# Patient Record
Sex: Female | Born: 1935 | Race: Black or African American | Hispanic: No | Marital: Married | Smoking: Never smoker
Health system: Southern US, Community
[De-identification: ages and names within clinical notes are randomized; demographics above are authoritative.]

## PROBLEM LIST (undated history)

## (undated) DIAGNOSIS — M1990 Unspecified osteoarthritis, unspecified site: Secondary | ICD-10-CM

## (undated) DIAGNOSIS — Z973 Presence of spectacles and contact lenses: Secondary | ICD-10-CM

## (undated) DIAGNOSIS — E119 Type 2 diabetes mellitus without complications: Secondary | ICD-10-CM

## (undated) DIAGNOSIS — I10 Essential (primary) hypertension: Secondary | ICD-10-CM

## (undated) DIAGNOSIS — C801 Malignant (primary) neoplasm, unspecified: Secondary | ICD-10-CM

## (undated) DIAGNOSIS — Z972 Presence of dental prosthetic device (complete) (partial): Secondary | ICD-10-CM

## (undated) HISTORY — PX: COLONOSCOPY: SHX174

## (undated) HISTORY — PX: ABDOMINAL HYSTERECTOMY: SHX81

---

## 2001-01-02 HISTORY — PX: PARTIAL COLECTOMY: SHX5273

## 2001-05-08 ENCOUNTER — Inpatient Hospital Stay (HOSPITAL_COMMUNITY): Admission: AD | Admit: 2001-05-08 | Discharge: 2001-05-27 | Payer: Self-pay | Admitting: Gastroenterology

## 2001-05-08 ENCOUNTER — Encounter (INDEPENDENT_AMBULATORY_CARE_PROVIDER_SITE_OTHER): Payer: Self-pay | Admitting: *Deleted

## 2001-05-08 ENCOUNTER — Encounter: Payer: Self-pay | Admitting: Gastroenterology

## 2001-05-09 ENCOUNTER — Encounter: Payer: Self-pay | Admitting: Gastroenterology

## 2001-05-12 ENCOUNTER — Encounter (HOSPITAL_BASED_OUTPATIENT_CLINIC_OR_DEPARTMENT_OTHER): Payer: Self-pay | Admitting: General Surgery

## 2001-05-13 ENCOUNTER — Encounter: Payer: Self-pay | Admitting: General Surgery

## 2001-05-14 ENCOUNTER — Encounter: Payer: Self-pay | Admitting: Gastroenterology

## 2001-05-18 ENCOUNTER — Encounter (HOSPITAL_BASED_OUTPATIENT_CLINIC_OR_DEPARTMENT_OTHER): Payer: Self-pay | Admitting: General Surgery

## 2001-05-21 ENCOUNTER — Encounter (HOSPITAL_BASED_OUTPATIENT_CLINIC_OR_DEPARTMENT_OTHER): Payer: Self-pay | Admitting: General Surgery

## 2003-12-16 ENCOUNTER — Ambulatory Visit: Payer: Self-pay | Admitting: Hematology & Oncology

## 2004-06-14 ENCOUNTER — Ambulatory Visit: Payer: Self-pay | Admitting: Hematology & Oncology

## 2004-12-20 ENCOUNTER — Ambulatory Visit: Payer: Self-pay | Admitting: Hematology & Oncology

## 2005-06-26 ENCOUNTER — Ambulatory Visit: Payer: Self-pay | Admitting: Hematology & Oncology

## 2005-06-28 LAB — COMPREHENSIVE METABOLIC PANEL
CO2: 27 mEq/L (ref 19–32)
Calcium: 9.8 mg/dL (ref 8.4–10.5)
Chloride: 103 mEq/L (ref 96–112)
Creatinine, Ser: 0.93 mg/dL (ref 0.40–1.20)
Glucose, Bld: 154 mg/dL — ABNORMAL HIGH (ref 70–99)
Sodium: 141 mEq/L (ref 135–145)
Total Bilirubin: 0.6 mg/dL (ref 0.3–1.2)
Total Protein: 7.5 g/dL (ref 6.0–8.3)

## 2005-06-28 LAB — CBC WITH DIFFERENTIAL/PLATELET
Eosinophils Absolute: 0.1 10*3/uL (ref 0.0–0.5)
HCT: 35.3 % (ref 34.8–46.6)
LYMPH%: 38.2 % (ref 14.0–48.0)
MONO#: 0.4 10*3/uL (ref 0.1–0.9)
NEUT#: 2.3 10*3/uL (ref 1.5–6.5)
NEUT%: 50.7 % (ref 39.6–76.8)
Platelets: 206 10*3/uL (ref 145–400)
WBC: 4.6 10*3/uL (ref 3.9–10.0)

## 2005-06-28 LAB — CEA: CEA: 6 ng/mL — ABNORMAL HIGH (ref 0.0–5.0)

## 2005-12-15 ENCOUNTER — Ambulatory Visit: Payer: Self-pay | Admitting: Hematology & Oncology

## 2005-12-20 LAB — CBC WITH DIFFERENTIAL/PLATELET
BASO%: 0.7 % (ref 0.0–2.0)
Basophils Absolute: 0 10*3/uL (ref 0.0–0.1)
EOS%: 1.1 % (ref 0.0–7.0)
Eosinophils Absolute: 0.1 10*3/uL (ref 0.0–0.5)
HCT: 35.3 % (ref 34.8–46.6)
HGB: 12.1 g/dL (ref 11.6–15.9)
LYMPH%: 35.2 % (ref 14.0–48.0)
MCH: 28.4 pg (ref 26.0–34.0)
MCHC: 34.3 g/dL (ref 32.0–36.0)
MCV: 82.7 fL (ref 81.0–101.0)
MONO#: 0.5 10*3/uL (ref 0.1–0.9)
MONO%: 9.2 % (ref 0.0–13.0)
NEUT#: 2.6 10*3/uL (ref 1.5–6.5)
NEUT%: 53.8 % (ref 39.6–76.8)
Platelets: 213 10*3/uL (ref 145–400)
RBC: 4.26 10*6/uL (ref 3.70–5.32)
RDW: 14.4 % (ref 11.3–14.5)
WBC: 4.9 10*3/uL (ref 3.9–10.0)
lymph#: 1.7 10*3/uL (ref 0.9–3.3)

## 2005-12-20 LAB — COMPREHENSIVE METABOLIC PANEL
Alkaline Phosphatase: 79 U/L (ref 39–117)
BUN: 13 mg/dL (ref 6–23)
Creatinine, Ser: 0.91 mg/dL (ref 0.40–1.20)
Glucose, Bld: 134 mg/dL — ABNORMAL HIGH (ref 70–99)
Sodium: 143 mEq/L (ref 135–145)
Total Bilirubin: 0.5 mg/dL (ref 0.3–1.2)

## 2006-06-18 ENCOUNTER — Ambulatory Visit: Payer: Self-pay | Admitting: Hematology & Oncology

## 2006-06-20 LAB — COMPREHENSIVE METABOLIC PANEL
Alkaline Phosphatase: 69 U/L (ref 39–117)
BUN: 11 mg/dL (ref 6–23)
CO2: 26 mEq/L (ref 19–32)
Creatinine, Ser: 0.84 mg/dL (ref 0.40–1.20)
Glucose, Bld: 159 mg/dL — ABNORMAL HIGH (ref 70–99)
Sodium: 141 mEq/L (ref 135–145)
Total Bilirubin: 0.5 mg/dL (ref 0.3–1.2)
Total Protein: 7.8 g/dL (ref 6.0–8.3)

## 2006-06-20 LAB — CBC WITH DIFFERENTIAL/PLATELET
Eosinophils Absolute: 0 10*3/uL (ref 0.0–0.5)
HCT: 34.9 % (ref 34.8–46.6)
LYMPH%: 37.7 % (ref 14.0–48.0)
MCV: 81.8 fL (ref 81.0–101.0)
MONO%: 8.2 % (ref 0.0–13.0)
NEUT#: 2.2 10*3/uL (ref 1.5–6.5)
NEUT%: 52.4 % (ref 39.6–76.8)
Platelets: 192 10*3/uL (ref 145–400)
RBC: 4.27 10*6/uL (ref 3.70–5.32)

## 2006-06-20 LAB — CEA: CEA: 8.5 ng/mL — ABNORMAL HIGH (ref 0.0–5.0)

## 2006-12-25 ENCOUNTER — Ambulatory Visit: Payer: Self-pay | Admitting: Gastroenterology

## 2010-05-20 NOTE — Consult Note (Signed)
[place]. [facility]  Patient:    Danielle Bright Visit Number: [id number] MRN: [id number]          Service Type: MED Location: [phone] Attending Physician:  Orland Mustard Dictated by:   Rosemarie Ax, N.P. Admit Date:  05/08/2001   CC:         Danielle Bright, M.D.  Danielle Bright, M.D.  [facility]  Dr. Volney Presser in [facility] L. Randa Bright, M.D.   Consultation Report  REFERRING PHYSICIAN:  James L. Randa Bright, M.D.  REASON FOR CONSULTATION:  Adenocarcinoma of the colon.  HISTORY OF PRESENT ILLNESS:  This is a 77 year old female, status post right hemicolectomy, ileocolectomy, with ileal transverse colotomy on April 21, 2001 by Danielle Bright in [place], [place].  Dr. Lorelei Pont report showed moderate well-differentiated adenocarcinoma extending through the outer muscular wall to involve the mesentery.  There were 0 of 12 lymph nodes showing metastatic disease.  The margins were clear and there was vascular invasion observed.  A CT of the abdomen preoperatively showed no liver metastasis or adenopathy.  Since surgery, Danielle Bright has displayed signs of partial small-bowel obstruction and a Gastrografin study confirmed the same on May 01, 2001.  She has been admitted for an exploratory laparotomy tomorrow on May 15, 2001 due to obstruction, possibly secondary to adhesions.  PAST MEDICAL HISTORY:  Hypertension.  Diabetes mellitus type 2.  Fibrocystic breast disease.  Hypercholesterolemia.  PAST SURGICAL HISTORY:  Colectomy.  Hysterectomy in 1980s.  ALLERGIES:  No known drug allergies.  MEDICATIONS: 1. Glucophage 500 mg t.i.d. 2. Altace 5 mg q.d. 3. Over-the-counter vitamin E.  FAMILY HISTORY:  Danielle Bright mother died of a CVA in 79.  Her father died in his 46s of a MI.  She had two brothers, one has died of esophageal cancer and one brother is alive.  She has two sisters who are living, one with  colon cancer and one alive and well.  She has several aunts and uncles with cancer which she states multiple types such as breast, cervical, lungs.  SOCIAL HISTORY:  Danielle Bright is married.  She is a retired Astronomer. now Psychologist, educational.  She has two daughters living in the [place] area and she exercises regularly.  REVIEW OF SYSTEMS:  She denies any shortness of breath, headache.  No cough, no chest or pleuritic pain.  She denies any nausea or vomiting and had literally no symptoms prior to a routine colonoscopy.  Her stools for hemoccult were negative.  She denies any problems with constipation or diarrhea prior to the surgery.  She has had no hematochezia.  No weight gain or weight loss.  She denies any increase in weakness or fatigue.  No night sweats.  PHYSICAL EXAMINATION:  GENERAL:  This is a pleasant 77 year old African-American female in no acute distress.  VITAL SIGNS:  Temperature is 99.5, pulse is 96, respirations 20, blood pressure is 138/61.  O2 is at 92% on room air.  HEENT:  Normocephalic.  Sclerae anicteric.  Mucus membranes moist.  Tongue is with a white coating without any lesions.  PERRLA.  EOMI.  LYMPH NODES:  The neck displays a right anterior cervical pulsating node. There are no axillary or inguinal nodes appreciated.  LUNGS:  Clear to auscultation bilaterally.  CARDIOVASCULAR:  Regular rate and rhythm.  No murmur and no gallop.  ABDOMEN:  The positive right upper and right lower quadrant tenderness with some distention.  EXTREMITIES:  Without clubbing, cyanosis, or edema.  There is no calf tenderness.  NEUROLOGICAL:  Alert and oriented x 3.  Strength is 5/5.  Cranial nerves II-XII are intact.  LABORATORY DATA:  CEA on April 21, 2001 was 7.9.  WBC upon admission 7.2, hemoglobin was 11.1, hematocrit 33.3, platelets are at 391,000.  Sodium 134, potassium 5.8, chloride 95, CO2 30, glucose 246, BUN 53, creatinine 1.3, calcium 9.7, total  protein 7.7, albumin 3.5, prealbumin 16.7 on May 11, 2001.  Abdominal films reveal a worsening small-bowel obstruction on May 12, 2001.  ASSESSMENT/PLAN:  This 77 year old female awaiting exploratory surgery for small-bowel obstruction, possibly due to secondary to abdominal adhesions. 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  We will be glad to follow Danielle Bright with you and will complete her staging with CTs of the neck and chest postsurgically and abdomen postsurgically.  The patient was seen and examined by Dr. Quintin Alto C. Bright and he will be glad to follow the patient postsurgically.Dictated by:   Rosemarie Ax, N.P. Attending Physician:  Orland Mustard DD:  05/14/01 TD:  05/15/01 Job: [id number]

## 2010-05-20 NOTE — Op Note (Signed)
Danielle Bright. [facility]  Patient:    Danielle Bright, Danielle Bright Visit Number: [id number] MRN: [id number]          Service Type: MED Location: [phone] Attending Physician:  Orland Mustard Dictated by:   Mardene Celeste. Lurene Shadow, M.D. Proc. Date: 05/15/01 Admit Date:  05/08/2001   CC:         Danielle Bright, M.D.   Operative Report  PREOPERATIVE DIAGNOSIS:  High grade partial small bowel obstruction following a right hemicolectomy.  POSTOPERATIVE DIAGNOSIS:  High grade partial small bowel obstruction secondary to adhesions and anastomotic stricture.  OPERATION PERFORMED:  Exploratory laparotomy, lysis of adhesions, small bowel resection, resection or ileocolic anastomosis, reanastomosis of ileum to colon.  SURGEON:  Mardene Celeste. Lurene Shadow, M.D.  ASSISTANT:  Marnee Spring. Wiliam Ke, M.D.  ANESTHESIA:  General.  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  Subsequent to her operation, she did well except that she was unable to tolerate oral feedings having recurrent episodes of distention, nausea and vomiting.  She was observed with total parenteral nutrition and n.p.o. for a while; however, she did not resolve.  She was subsequently transferred to the [facility] where she was continued on observation and total parenteral nutrition without any resolution in her symptoms.  Following full disclosure of the risks and potential benefits of surgery, the patient gives consent and is brought to the operating room now for exploration, adhesiolysis with possible bowel resection.  DESCRIPTION OF PROCEDURE:  Following the induction of satisfactory general anesthesia with the patient positioned supinely, the abdomen was prepped and draped to be included in a sterile operative field.  The old previously made midline incision was reopened and  deepened through the skin and subcutaneous tissues down through the peritoneum.  There were multiple fibrous and difficult adhesions to the anterior abdominal wall which were dissected free. There were multiple loops of gnarled and bowel stuck deep within the pelvis and then dissecting this free, enterotomies were created and a small bowel resection was necessary.  The small bowel resection was carried out by dividing the bowel between GIA 75 staplers and performing a functional end-to-end GIA stapled anastomosis closed with a TA-60 stapling device.  This anastomosis was noted to be widely patent and the bowel appeared to be fully viable.  Dissection was carried out lysing multiple very dense adhesions up to the region of the anastomosis which was located in the subhepatic space.  This was mobilized.  The region of the anastomosis appeared to be quite strictured and fibrotic and the proximal colon and distal small bowel were then divided between clamps and secured and the mesentery secured with ties of 2-0 silk. The resected bowel which included the proximal colon and distal ileum was removed and forwarded for pathologic evaluation.  Again, a functional end-to-end anastomosis carried out with a GIA stapling device and a TA60 stapling device.  The ileocolic anastomosis was noted to be widely patent. The small intestine was run from the ileocolic anastomosis retrograde to the ligament of Treitz with all adhesions having been taken down.  The peritoneal cavity was then thoroughly irrigated with multiple aliquots of normal saline. Sponge, instrument and sharp counts were verified.  I used two sheets of seprafilm to separate the bowel from the anterior abdominal wall and then closed the anterior abdominal wall with a running suture of running #1  Novofil.  The subcutaneous tissues were then thoroughly irrigated with normal saline and the skin was closed with stainless steel staples.   Sterile dressings applied.  Anesthetic reversed.  Patient removed from the operating room to the recovery room in stable condition having tolerated the procedure well. Dictated by:   Mardene Celeste. Lurene Shadow, M.D. Attending Physician:  Orland Mustard DD:  05/15/01 TD:  05/16/01 Job: [id number]

## 2010-05-20 NOTE — Discharge Summary (Signed)
NAMEDEVONNA, OBOYLE NO.:  [id number]   MEDICAL RECORD NO.:  [id number]                   PATIENT TYPE:  NP   LOCATION:  5710                                 FACILITY:  [facility]   PHYSICIAN:  Luisa Hart L. Lurene Shadow, M.D.             DATE OF BIRTH:  Apr 30, 1927   DATE OF ADMISSION:  05/08/2001  DATE OF DISCHARGE:  05/27/2001                                 DISCHARGE SUMMARY   ADMISSION DIAGNOSES:  Partial small bowel obstruction.   DISCHARGE DIAGNOSES:  Partial small bowel obstruction, type 2 diabetes  mellitus, hypercholesterolemia, hypertension.   PROCEDURE:  Exploratory laparotomy with adhesiolysis, small bowel resection,  resection of ileocolic anastomosis with neoileocolic anastomosis.   COMPLICATIONS:  None.   CONDITION ON DISCHARGE:  Improved.   CHIEF COMPLAINT:  The patient is a 77 year old lady admitted to the hospital  by Dr. Vilinda Boehringer.  She had undergone a right hemicolectomy and  ileocolostomy in the early part of April and initially did well, but  continued to have symptoms of recurrent episodes of small bowel obstruction  noted by nausea, vomiting, and abdominal distention.  It was felt initially  that this was due to the anastomotic site and she was treated  conservatively, later placed on total nutritional admixture for a while.  However, her bowel obstructive symptoms did not improve.  She was  subsequently transferred to [facility] under Dr. Randa Evens' care and  on May 09, 2001 I saw her for initial consultation.  At that time she was  feeling somewhat better and had actually passed some very small liquid bowel  movements.  Her bowel sounds were still obstructive.  It was decided at that  time to resume her TNA and continue to observe her to see whether or not her  bowel obstruction would resolve.  Over the ensuing days she did get  transiently better but upon an attempt at feeding she continued with signs  of bowel  obstruction.  Prior to surgery she was seen in consultation by  oncologist, Dr. Lyndal Pulley, who will continue to follow her for what was  reported as a Dukes B1 carcinoma of the colon.  On May 15, 2001 we decided  to go ahead and explore the patient and she was taken to the operating room  where she underwent exploration that showed very dense adhesions with a  stricture at the anastomotic site.  She underwent adhesiolysis with small  bowel resection and resection of the ileocolic anastomosis with the  reconstitution of her bowel continuity.  Her postoperative course was benign  except for a mildly prolonged ileus.  In the interim she was seen in  consultation by Dr. Margaretmary Bayley, endocrinology for continued monitoring  and management of her diabetes.  During postoperative period she was  continued on total parenteral nutrition.  On May 32 she finally had begun to  pass  flatus freely and subsequently continued to have bowel movements  starting on her eighth postoperative day.  Her diet was advanced slowly and  on May 27, 2001 she was discharged home to be followed up in the office in  two weeks.   DISCHARGE MEDICATIONS:  1. Maxidone for pain p.r.n.  2. Altace for control of her blood pressure.  3. She was discharged home on a trial of Glucophage.    ACTIVITY:  As tolerated.   DIET:  2000 calorie ADA diet.                                               Mardene Celeste Lurene Shadow, M.D.    PLB/MEDQ  D:  07/30/2001  T:  08/02/2001  Job:  [id number]

## 2010-05-20 NOTE — H&P (Signed)
[place]. [facility]  Patient:    Danielle Bright, Danielle Bright Visit Number: [id number] MRN: [id number]          Service Type: MED Location: [phone] Attending Physician:  Orland Mustard Dictated by:   Llana Aliment. Randa Evens, M.D. Proc. Date: 05/08/01 Admit Date:  05/08/2001   CC:         [place] Qua, [place], [place]  Luisa Hart L. Lurene Shadow, M.D.   History and Physical  DATE OF BIRTH:  Jan 14, 1927.  REASON FOR ADMISSION:  Postoperative small bowel obstruction.  HISTORY OF PRESENT ILLNESS:  A 77 year old woman who underwent a screening colonoscopy at [facility], was found to have a cecal cancer.  She was admitted on April 20 and underwent a resection of this.  This was done by Dr. Volney Presser.  At the time of surgery she was noted to have extensive adhesions of the small bowel.  She underwent a right hemicolectomy, ileocolectomy, with an ileotransverse colotomy.  The pathology report returned showing adenocarcinoma of the colonic mucosa, moderately well-differentiated.  It extended through the outer muscular wall to involve the mesentery.  This was felt to be a Dukes B1.  All the lymph nodes were negative.  The patient has had a rocky postoperative course and has just never really improved.  She has had abdominal films and small bowel series and CTs showing a partial small bowel obstruction.  This had been treated conservatively.  On April 30 she had a Gastrografin small bowel series that showed a partial obstruction.  She has continued to have this and just has not gotten well, and she and her family would like to be transferred here.  She has been on a multitude of medications, specifically Zelnorm, Reglan, erythromycin, all of which has failed to help.  If anything, she feels that they have made things worse.  She has still bee unable to take anything but sips of water and ice chips.  She is passing air.  She is having some flatulent episodes with some stool.  At  this point in time, she is approximately three weeks out from her surgery and still is not better.  MEDICATIONS:  Glucophage, Altace, Lipitor, sliding scale insulin, Reglan, Zelnorm, Zofran, erythromycin, Valium, and Intralipids.  ALLERGIES:  The patient has no drug allergies.  PAST MEDICAL HISTORY:  The patient has mild hypertension and noninsulin-dependent diabetes.  PAST SURGICAL HISTORY:  Her only previous surgery prior to the colectomy was a hysterectomy in the 1980s.  FAMILY HISTORY:  Mother died of a stroke.  Father died of a heart attack. There is a sibling who did have colon cancer approximately three years ago and is still living.  There are gallstones in the family.  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  PHYSICAL EXAMINATION:  VITAL SIGNS:  Temperature 97.6, pulse 100, blood pressure 106/66.  GENERAL:  A pleasant black female in no acute distress.  HEENT:  Sclerae are not icteric.  NECK:  Supple, no lymphadenopathy.  CHEST:  Lungs clear.  CARDIAC:  Regular rate and rhythm without murmurs or gallops.  ABDOMEN:  Soft with a well-healed surgical scar in the midline, is slightly distended.  Bowel sounds appear normal.  I do not appreciate anything more than the normal amount of postoperative tenderness.  RECTAL:  Not done at this time.  EXTREMITIES:  There is an IV in the left hand.  No  pitting edema.  Good peripheral pulses.  ASSESSMENT: 1. Probable postoperative small bowel obstruction, more than likely due to    adhesions.  The patient had adhesions noted on exam and at this point I    think likely come to a re-exploration. 2. Dukes B1 carcinoma of the cecum, status post right hemicolectomy. 3. Status post hysterectomy. 4. Noninsulin-dependent diabetes.  PLAN:  Will admit to the hospital, place on IV antibiotics, order acute abdominal series to look for signs of obstruction.   Will ask Dr. Lurene Shadow to see in consultation.  The patient will likely need an oncology consultation while here as well.  Will get routine labs and go from there. Dictated by:   Llana Aliment. Randa Evens, M.D. Attending Physician:  Orland Mustard DD:  05/09/01 TD:  05/10/01 Job: [id number]

## 2011-07-17 ENCOUNTER — Ambulatory Visit: Payer: Medicare Other

## 2011-07-17 ENCOUNTER — Other Ambulatory Visit (HOSPITAL_BASED_OUTPATIENT_CLINIC_OR_DEPARTMENT_OTHER): Payer: Medicare Other | Admitting: Lab

## 2011-07-17 ENCOUNTER — Ambulatory Visit (HOSPITAL_BASED_OUTPATIENT_CLINIC_OR_DEPARTMENT_OTHER): Payer: Medicare Other | Admitting: Hematology & Oncology

## 2011-07-17 ENCOUNTER — Ambulatory Visit: Payer: Medicare Other | Admitting: Medical

## 2011-07-17 VITALS — BP 150/82 | HR 80 | Temp 97.9°F | Ht 65.0 in | Wt 132.0 lb

## 2011-07-17 DIAGNOSIS — C189 Malignant neoplasm of colon, unspecified: Secondary | ICD-10-CM

## 2011-07-17 DIAGNOSIS — D649 Anemia, unspecified: Secondary | ICD-10-CM

## 2011-07-17 DIAGNOSIS — Z85038 Personal history of other malignant neoplasm of large intestine: Secondary | ICD-10-CM

## 2011-07-17 DIAGNOSIS — R970 Elevated carcinoembryonic antigen [CEA]: Secondary | ICD-10-CM

## 2011-07-17 LAB — COMPREHENSIVE METABOLIC PANEL
ALT: 19 U/L (ref 0–35)
AST: 24 U/L (ref 0–37)
Albumin: 4.6 g/dL (ref 3.5–5.2)
Calcium: 10.1 mg/dL (ref 8.4–10.5)
Chloride: 106 mEq/L (ref 96–112)
Creatinine, Ser: 0.97 mg/dL (ref 0.50–1.10)
Potassium: 4.2 mEq/L (ref 3.5–5.3)

## 2011-07-17 LAB — IRON AND TIBC
%SAT: 27 % (ref 20–55)
UIBC: 236 ug/dL (ref 125–400)

## 2011-07-17 LAB — CEA: CEA: 4.8 ng/mL (ref 0.0–5.0)

## 2011-07-17 LAB — CBC WITH DIFFERENTIAL (CANCER CENTER ONLY)
BASO%: 0.8 % (ref 0.0–2.0)
EOS%: 1.8 % (ref 0.0–7.0)
LYMPH#: 2.6 10*3/uL (ref 0.9–3.3)
MCHC: 35.5 g/dL (ref 32.0–36.0)
NEUT#: 3.2 10*3/uL (ref 1.5–6.5)
RDW: 13.7 % (ref 11.1–15.7)

## 2011-07-17 NOTE — Progress Notes (Signed)
This office note has been dictated.

## 2011-07-18 DIAGNOSIS — Z85038 Personal history of other malignant neoplasm of large intestine: Secondary | ICD-10-CM | POA: Insufficient documentation

## 2011-07-18 NOTE — Progress Notes (Signed)
CC:   [employer], Fax [phone]  DIAGNOSES: 1. Elevated CEA. 2. 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 3.  HISTORY OF PRESENT ILLNESS:  Danielle Bright is a very nice 78 year old African American female.  We first saw her back in 2003.  At that point in time, she had adenocarcinoma of the transverse colon.  She had stage II disease.  This was resected.  She did not need any type of adjuvant chemotherapy.  We last saw her back in June of 2008.  At that point in time, her CEA was I think at 4.7.  She now is being followed at [facility].  She has been noted to have a rise in her CEA.  She has diabetes that is not that well-controlled.  She is under stress at home trying to take care of a husband who has Alzheimer's.  She has not recalled any changes in medications.  Going back to June of 2012, CEA was 7.6.  She had elevated hemoglobin A1c.  LFTs were normal.  BUN and creatinine were okay.  I think in June of 2013, her CEA was found to be a little more elevated at 8.3.  Because of the increase in the CEA, it was felt that she needed to come back to the [facility] for evaluation and possibility of studies.  Again, she feels okay.  She has not noted any abdominal pain.  There is no early satiety.  There is no cough.  There are no rashes.  She has had no arm or leg swelling.  There has been no bleeding.  There is no headache.  She has had her routine health care exam, mammogram and colonoscopy.  I think last colonoscopy was 3 years ago.  Again, she was kindly referred back to the [facility] system so that we could provide followup for this elevated CEA.  Again, she feels well.  She really has no specific complaints.  PAST MEDICAL HISTORY:  Remarkable for: 1. Diabetes. 2. Hypertension. 3. Anxiety.  ALLERGIES:  None.  MEDICATIONS: 1. Norvasc 5 mg p.o. daily. 2. Bystolic 20 mg p.o. daily. 3. Klonopin 0.5  mg p.o. b.i.d. p.r.n. 4. Hydrochlorothiazide 12.5 mg p.o. daily. 5. Metformin 1000 mg p.o. b.i.d. 6. Altace 10 mg p.o. b.i.d. 7. Zoloft 50 mg p.o. daily.  SOCIAL HISTORY:  Negative for tobacco use.  There is no alcohol use.  FAMILY HISTORY:  Noncontributory.  REVIEW OF SYSTEMS:  As stated in history of present illness.  No additional findings noted on a 12 system review.  PHYSICAL EXAMINATION:  General:  This is an elderly but well-nourished black female in no obvious distress.  Vital signs:  Show a temperature of 97.9, pulse 80, respiratory rate 18, blood pressure 150/82.  Weight is 132.  Head and neck:  Shows a normocephalic, atraumatic skull.  There are no ocular or oral lesions.  There are no palpable cervical or supraclavicular lymph nodes.  Lungs:  Clear bilaterally.  Cardiac: Regular rate and rhythm with a normal S1 and S2.  There are no murmurs, rubs or bruits.  Abdomen:  Soft with good bowel sounds.  There is no palpable abdominal mass.  There is no fluid wave.  There is no fluid. There is a little keloid scar from her laparotomy wound.  There is no palpable hepatosplenomegaly.  Back:  No tenderness over the spine, ribs or hips.  Extremities:  Shows no clubbing, cyanosis or edema. Neurological:  Shows  no focal neurological deficits.  LABORATORY STUDIES:  Show a white cell count of 6.6, hemoglobin 11.6, hematocrit 32.7, platelet count is 188.  IMPRESSION:  Danielle Bright is a very nice 78 year old African American female.  She had stage II colon cancer 10 years ago.  I just cannot imagine that this is recurring.  I am not sure why the CEA would be a little bit elevated.  I could not imagine her having a 2nd kind of cancer.  I would not pursue random scanning on her at this point in time unless something specific shows up.  She does have diabetes.  I do not think this really would affect her CEA level.  I do not see any evidence of infection.  She does not smoke.  I am just  willing to follow along for right now.  I really believe that we can just be cautious.  I suppose a 2nd malignancy is possible although I certainly cannot find this on her physical exam.  We will not make a plan for Danielle Bright to come back as of yet.  I want to try to take care of everything over the phone if I can.  I spent a good hour or so with Danielle Bright.  It was nice to see her again. It has been a good 7 years since I last saw her.    ______________________________ Josph Macho, M.D. PRE/MEDQ  D:  07/17/2011  T:  07/17/2011  Job:  [id number]

## 2011-07-20 ENCOUNTER — Other Ambulatory Visit: Payer: Self-pay

## 2011-07-21 ENCOUNTER — Telehealth: Payer: Self-pay | Admitting: *Deleted

## 2011-07-21 NOTE — Telephone Encounter (Signed)
Message copied by Anselm Jungling on Fri Jul 21, 2011 11:49 AM ------      Message from: Arlan Organ R      Created: Thu Jul 20, 2011  5:34 PM       Call her and let her that her tumor marker-CEA-is normal. No need for any additional tests or x-rays. Thanks. Cindee Lame

## 2011-07-21 NOTE — Telephone Encounter (Signed)
Called patient left a message for her to call us back about her lab results.

## 2011-07-21 NOTE — Telephone Encounter (Signed)
Message copied by Anselm Jungling on Fri Jul 21, 2011 11:00 AM ------      Message from: Arlan Organ R      Created: Thu Jul 20, 2011  5:34 PM       Call her and let her that her tumor marker-CEA-is normal. No need for any additional tests or x-rays. Thanks. Cindee Lame

## 2011-07-21 NOTE — Telephone Encounter (Signed)
Called patient to let her know that her tumor marker (CEA) was normal and no need for additional tests or xrays'

## 2012-07-26 ENCOUNTER — Other Ambulatory Visit: Payer: Self-pay | Admitting: Orthopedic Surgery

## 2012-08-13 ENCOUNTER — Encounter (HOSPITAL_BASED_OUTPATIENT_CLINIC_OR_DEPARTMENT_OTHER): Payer: Self-pay | Admitting: *Deleted

## 2012-08-13 NOTE — Progress Notes (Signed)
Pt very sharp-husband in nursing home-said she had recent lab and ekg [facility] will call

## 2012-08-15 ENCOUNTER — Encounter (HOSPITAL_BASED_OUTPATIENT_CLINIC_OR_DEPARTMENT_OTHER): Payer: Self-pay | Admitting: Anesthesiology

## 2012-08-15 ENCOUNTER — Ambulatory Visit (HOSPITAL_BASED_OUTPATIENT_CLINIC_OR_DEPARTMENT_OTHER)
Admission: RE | Admit: 2012-08-15 | Discharge: 2012-08-15 | Disposition: A | Discharge status: Home / Self Care | Payer: Medicare Other | Source: Ambulatory Visit | Attending: Orthopedic Surgery | Admitting: Orthopedic Surgery

## 2012-08-15 ENCOUNTER — Ambulatory Visit (HOSPITAL_BASED_OUTPATIENT_CLINIC_OR_DEPARTMENT_OTHER): Payer: Medicare Other | Admitting: Anesthesiology

## 2012-08-15 ENCOUNTER — Encounter (HOSPITAL_BASED_OUTPATIENT_CLINIC_OR_DEPARTMENT_OTHER): Payer: Self-pay | Admitting: *Deleted

## 2012-08-15 ENCOUNTER — Encounter (HOSPITAL_BASED_OUTPATIENT_CLINIC_OR_DEPARTMENT_OTHER)
Admission: RE | Disposition: A | Discharge status: Home / Self Care | Payer: Self-pay | Source: Ambulatory Visit | Attending: Orthopedic Surgery

## 2012-08-15 DIAGNOSIS — M129 Arthropathy, unspecified: Secondary | ICD-10-CM | POA: Insufficient documentation

## 2012-08-15 DIAGNOSIS — Z79899 Other long term (current) drug therapy: Secondary | ICD-10-CM | POA: Insufficient documentation

## 2012-08-15 DIAGNOSIS — E119 Type 2 diabetes mellitus without complications: Secondary | ICD-10-CM | POA: Insufficient documentation

## 2012-08-15 DIAGNOSIS — Z85038 Personal history of other malignant neoplasm of large intestine: Secondary | ICD-10-CM | POA: Insufficient documentation

## 2012-08-15 DIAGNOSIS — Z9049 Acquired absence of other specified parts of digestive tract: Secondary | ICD-10-CM | POA: Insufficient documentation

## 2012-08-15 DIAGNOSIS — L819 Disorder of pigmentation, unspecified: Secondary | ICD-10-CM | POA: Insufficient documentation

## 2012-08-15 DIAGNOSIS — I10 Essential (primary) hypertension: Secondary | ICD-10-CM | POA: Insufficient documentation

## 2012-08-15 DIAGNOSIS — L03019 Cellulitis of unspecified finger: Secondary | ICD-10-CM | POA: Insufficient documentation

## 2012-08-15 HISTORY — DX: Essential (primary) hypertension: I10

## 2012-08-15 HISTORY — DX: Presence of spectacles and contact lenses: Z97.3

## 2012-08-15 HISTORY — DX: Type 2 diabetes mellitus without complications: E11.9

## 2012-08-15 HISTORY — DX: Malignant (primary) neoplasm, unspecified: C80.1

## 2012-08-15 HISTORY — DX: Presence of dental prosthetic device (complete) (partial): Z97.2

## 2012-08-15 HISTORY — DX: Unspecified osteoarthritis, unspecified site: M19.90

## 2012-08-15 HISTORY — PX: NAILBED REPAIR: SHX5028

## 2012-08-15 LAB — POCT I-STAT, CHEM 8
Calcium, Ion: 1.17 mmol/L (ref 1.13–1.30)
Glucose, Bld: 155 mg/dL — ABNORMAL HIGH (ref 70–99)
HCT: 37 % (ref 36.0–46.0)
Hemoglobin: 12.6 g/dL (ref 12.0–15.0)
TCO2: 27 mmol/L (ref 0–100)

## 2012-08-15 LAB — GLUCOSE, CAPILLARY: Glucose-Capillary: 136 mg/dL — ABNORMAL HIGH (ref 70–99)

## 2012-08-15 SURGERY — REPAIR, NAIL BED
Anesthesia: Monitor Anesthesia Care | Site: Thumb | Laterality: Left | Wound class: Clean

## 2012-08-15 MED ORDER — CHLORHEXIDINE GLUCONATE 4 % EX LIQD: 60.0000 mL | Freq: Once | CUTANEOUS | Status: DC

## 2012-08-15 MED ORDER — FENTANYL CITRATE 0.05 MG/ML IJ SOLN: 25.0000 ug | INTRAMUSCULAR | Status: DC | PRN

## 2012-08-15 MED ORDER — ONDANSETRON HCL 4 MG/2ML IJ SOLN
INTRAMUSCULAR | Status: DC | PRN
  Administered 2012-08-15: 4 mg via INTRAVENOUS

## 2012-08-15 MED ORDER — BUPIVACAINE HCL (PF) 0.25 % IJ SOLN
INTRAMUSCULAR | Status: DC | PRN
  Administered 2012-08-15: 4.5 mL

## 2012-08-15 MED ORDER — ONDANSETRON HCL 4 MG/2ML IJ SOLN: 4.0000 mg | Freq: Once | INTRAMUSCULAR | Status: DC | PRN

## 2012-08-15 MED ORDER — PROPOFOL 10 MG/ML IV EMUL
INTRAVENOUS | Status: DC | PRN
  Administered 2012-08-15: 25 ug/kg/min via INTRAVENOUS

## 2012-08-15 MED ORDER — HYDROCODONE-ACETAMINOPHEN 5-325 MG PO TABS: ORAL_TABLET | ORAL | Status: DC

## 2012-08-15 MED ORDER — LIDOCAINE HCL (PF) 1 % IJ SOLN
INTRAMUSCULAR | Status: DC | PRN
  Administered 2012-08-15: 4.5 mL

## 2012-08-15 MED ORDER — LACTATED RINGERS IV SOLN
INTRAVENOUS | Status: DC
  Administered 2012-08-15: 08:00:00 via INTRAVENOUS

## 2012-08-15 MED ORDER — MIDAZOLAM HCL 5 MG/5ML IJ SOLN
INTRAMUSCULAR | Status: DC | PRN
  Administered 2012-08-15: 1 mg via INTRAVENOUS

## 2012-08-15 MED ORDER — CEFAZOLIN SODIUM-DEXTROSE 2-3 GM-% IV SOLR
2.0000 g | INTRAVENOUS | Status: AC
  Administered 2012-08-15: 2 g via INTRAVENOUS

## 2012-08-15 MED ORDER — FENTANYL CITRATE 0.05 MG/ML IJ SOLN
INTRAMUSCULAR | Status: DC | PRN
  Administered 2012-08-15: 50 ug via INTRAVENOUS

## 2012-08-15 MED ORDER — MIDAZOLAM HCL 2 MG/2ML IJ SOLN: 1.0000 mg | INTRAMUSCULAR | Status: DC | PRN

## 2012-08-15 MED ORDER — FENTANYL CITRATE 0.05 MG/ML IJ SOLN: 50.0000 ug | INTRAMUSCULAR | Status: DC | PRN

## 2012-08-15 MED ORDER — PROPOFOL 10 MG/ML IV BOLUS
INTRAVENOUS | Status: DC | PRN
  Administered 2012-08-15: 50 mg via INTRAVENOUS

## 2012-08-15 SURGICAL SUPPLY — 51 items
BANDAGE CONFORM 2  STR LF (GAUZE/BANDAGES/DRESSINGS) ×1 IMPLANT
BANDAGE ELASTIC 3 VELCRO ST LF (GAUZE/BANDAGES/DRESSINGS) IMPLANT
BLADE MINI RND TIP GREEN BEAV (BLADE) ×1 IMPLANT
BLADE SURG 15 STRL LF DISP TIS (BLADE) ×2 IMPLANT
BLADE SURG 15 STRL SS (BLADE) ×4
BNDG CMPR 9X4 STRL LF SNTH (GAUZE/BANDAGES/DRESSINGS) ×1
BNDG CMPR MD 5X2 ELC HKLP STRL (GAUZE/BANDAGES/DRESSINGS)
BNDG COHESIVE 1X5 TAN STRL LF (GAUZE/BANDAGES/DRESSINGS) ×1 IMPLANT
BNDG ELASTIC 2 VLCR STRL LF (GAUZE/BANDAGES/DRESSINGS) IMPLANT
BNDG ESMARK 4X9 LF (GAUZE/BANDAGES/DRESSINGS) ×1 IMPLANT
CHLORAPREP W/TINT 26ML (MISCELLANEOUS) ×2 IMPLANT
CLOTH BEACON ORANGE TIMEOUT ST (SAFETY) ×2 IMPLANT
CORDS BIPOLAR (ELECTRODE) ×1 IMPLANT
COVER MAYO STAND STRL (DRAPES) ×2 IMPLANT
COVER TABLE BACK 60X90 (DRAPES) ×2 IMPLANT
DRAPE EXTREMITY T 121X128X90 (DRAPE) ×2 IMPLANT
DRAPE SURG 17X23 STRL (DRAPES) ×2 IMPLANT
GAUZE XEROFORM 1X8 LF (GAUZE/BANDAGES/DRESSINGS) ×2 IMPLANT
GLOVE BIO SURGEON STRL SZ7.5 (GLOVE) ×2 IMPLANT
GLOVE BIOGEL PI IND STRL 7.0 (GLOVE) IMPLANT
GLOVE BIOGEL PI INDICATOR 7.0 (GLOVE) ×1
GLOVE ECLIPSE 6.5 STRL STRAW (GLOVE) ×1 IMPLANT
GLOVE EXAM NITRILE LRG STRL (GLOVE) ×1 IMPLANT
GOWN BRE IMP PREV XXLGXLNG (GOWN DISPOSABLE) ×2 IMPLANT
GOWN PREVENTION PLUS XLARGE (GOWN DISPOSABLE) ×2 IMPLANT
NDL HYPO 25X1 1.5 SAFETY (NEEDLE) IMPLANT
NEEDLE HYPO 25X1 1.5 SAFETY (NEEDLE) ×2 IMPLANT
NS IRRIG 1000ML POUR BTL (IV SOLUTION) ×2 IMPLANT
PACK BASIN DAY SURGERY FS (CUSTOM PROCEDURE TRAY) ×2 IMPLANT
PAD CAST 3X4 CTTN HI CHSV (CAST SUPPLIES) IMPLANT
PAD CAST 4YDX4 CTTN HI CHSV (CAST SUPPLIES) IMPLANT
PADDING CAST ABS 4INX4YD NS (CAST SUPPLIES)
PADDING CAST ABS COTTON 4X4 ST (CAST SUPPLIES) ×1 IMPLANT
PADDING CAST COTTON 3X4 STRL (CAST SUPPLIES)
PADDING CAST COTTON 4X4 STRL (CAST SUPPLIES)
SPLINT FINGER 5/8X3.25 (SOFTGOODS) IMPLANT
SPLINT FINGER FOAM 3 9119 05 (SOFTGOODS) ×2
SPONGE GAUZE 4X4 12PLY (GAUZE/BANDAGES/DRESSINGS) ×2 IMPLANT
STOCKINETTE 4X48 STRL (DRAPES) ×2 IMPLANT
SUT CHROMIC 5 0 P 3 (SUTURE) IMPLANT
SUT CHROMIC 6 0 CE2 363 13 (SUTURE) IMPLANT
SUT CHROMIC 6 0 PS 4 (SUTURE) ×1 IMPLANT
SUT ETHILON 3 0 PS 1 (SUTURE) IMPLANT
SUT ETHILON 4 0 PS 2 18 (SUTURE) IMPLANT
SUT MON AB 5-0 P3 18 (SUTURE) ×1 IMPLANT
SUT VIC AB 5-0 P-3 18X BRD (SUTURE) IMPLANT
SUT VIC AB 5-0 P3 18 (SUTURE)
SYR BULB 3OZ (MISCELLANEOUS) ×2 IMPLANT
SYR CONTROL 10ML LL (SYRINGE) ×1 IMPLANT
TOWEL OR 17X24 6PK STRL BLUE (TOWEL DISPOSABLE) ×3 IMPLANT
UNDERPAD 30X30 INCONTINENT (UNDERPADS AND DIAPERS) ×2 IMPLANT

## 2012-08-15 NOTE — Op Note (Signed)
Danielle Bright, Danielle Bright NO.:  [id number]  MEDICAL RECORD NO.:  [id number]  LOCATION:                                 FACILITY:  PHYSICIAN:  Betha Loa, MD             DATE OF BIRTH:  DATE OF PROCEDURE:  08/15/2012 DATE OF DISCHARGE:                              OPERATIVE REPORT   PREOPERATIVE DIAGNOSIS:  Left thumb melanonychia.  POSTOPERATIVE DIAGNOSIS:  Left thumb melanonychia.  PROCEDURE:  Left thumb nail unit biopsy.  SURGEON:  Betha Loa, MD  ASSISTANT:  None.  ANESTHESIA:  MAC with digital block.  IV FLUIDS:  Per anesthesia flow sheet.  ESTIMATED BLOOD LOSS:  Minimal.  COMPLICATIONS:  None.  SPECIMENS:  Left thumb nail unit to Pathology.  TOURNIQUET TIME:  31 minutes.  DISPOSITION:  Stable to PACU.  INDICATIONS:  [name] is an 79 year old right-hand dominant female, who has noted a dark streak in her left thumbnail for approximately 6 months.  She is not sure how long it was there before that.  She is concerned this could be a subungual melanoma as she has had a previous cancer.  She wished to have it biopsied.  Risks, benefits, and alternatives of surgery were discussed including the risk of blood loss, infection, damage to nerves, vessels, tendons, ligaments, bone; failure of surgery; need for additional surgery, complications with wound healing, continued pain, and nail deformity.  She voiced understanding of these risks and elected to proceed.  OPERATIVE COURSE:  After being identified preoperatively by myself, the patient and I agreed upon procedure and site of procedure.  Surgical site was marked.  The risks, benefits, and alternatives of surgery were reviewed and she wished to proceed.  Surgical consent had been signed. She was given IV Ancef as preoperative antibiotic prophylaxis.  She was transferred to the operating room and placed on the operating table in supine position with left upper extremity on arm board.  MAC  anesthesia was induced by anesthesiologist.  A digital block was performed with 8.5 mL of half and half solution of 1% plain Xylocaine and 0.25% plain Marcaine.  This was adequate to give digital anesthesia to the thumb. The left hand was prepped and draped in normal sterile orthopedic fashion.  A surgical pause was performed between surgeons, anesthesia, operating staff, and all were in agreement as to the patient, procedure, and site of procedure.  Tourniquet at the proximal aspect of the forearm was inflated to 250 mmHg after gravity exsanguination of the hand and exsanguination of the distal forearm.  The nail was removed using a freer elevator.  The area of the dark streak was marked.  There was no distinct dark lesion in the nail bed, but in the nail fold there was some different looking tissue.  An ellipsoid nail unit biopsy was performed. It was sent to Pathology for examination.  The remaining portion of the nail bed was freed up using the [facility] blade.  A 2 mm biopsy had been taken.  The wound was copiously irrigated with sterile saline.  A 6-0 chromic suture was used to reappose the  nail bed tissues.  This was able to be done all the way back into the nail fold.  The dorsal skin tissue was repaired with 5-0 Monocryl.  This apposed all soft tissues well.  A piece of Xeroform was placed in nail fold.  The wound was dressed with sterile Xeroform, 4x4s, and wrapped with a Kling dressing lightly.  An AlumaFoam splint was placed and wrapped with Coban dressing lightly. Tourniquet was deflated at 31 minutes.  The operative drapes were broken down.  The patient was awoken from anesthesia safely.  She was transferred back to stretcher and taken to PACU in stable condition.  I will see back in the office in 1 week for postoperative followup.  I will give her Norco 5/325, 1-2 p.o. q.6 hours p.r.n. pain, dispensed #30.     Betha Loa, MD     KK/MEDQ  D:  08/15/2012  T:   08/15/2012  Job:  [id number]

## 2012-08-15 NOTE — H&P (Signed)
Danielle Bright is an 79 y.o. female.   Chief Complaint: left thumb melanonychia HPI: 79 yo rhd female with faint dark streak in left thumb nail x 6 months or more.  She does not remember any injury to the area.  No pain.  No previous dark streaks in nails and no other streaks at this time.  Past Medical History  Diagnosis Date  . Hypertension   . Diabetes mellitus without complication   . Cancer     colon  . Arthritis   . Wears glasses   . Wears dentures     full top,lower partial    Past Surgical History  Procedure Laterality Date  . Partial colectomy  2003    small bowel and part colon resection-cancer  . Abdominal hysterectomy    . Colonoscopy      x3    History reviewed. No pertinent family history. Social History:  reports that she has never smoked. She does not have any smokeless tobacco history on file. She reports that she does not drink alcohol or use illicit drugs.  Allergies: No Known Allergies  Medications Prior to Admission  Medication Sig Dispense Refill  . amLODipine (NORVASC) 5 MG tablet Take 5 mg by mouth daily.       Marland Kitchen BYSTOLIC 20 MG TABS 20 mg every morning.       . clonazePAM (KLONOPIN) 0.5 MG disintegrating tablet Take 0.5 mg by mouth 2 (two) times daily as needed.       . hydrochlorothiazide (HYDRODIURIL) 25 MG tablet Take 12.5 mg by mouth daily.      Marland Kitchen LEVEMIR FLEXPEN 100 UNIT/ML injection 30 Units every evening.       . metFORMIN (GLUCOPHAGE) 1000 MG tablet Take 1,000 mg by mouth 2 (two) times daily with a meal.       . ramipril (ALTACE) 10 MG capsule Take 10 mg by mouth daily.       . sertraline (ZOLOFT) 50 MG tablet         Results for orders placed during the hospital encounter of 08/15/12 (from the past 48 hour(s))  POCT I-STAT, CHEM 8     Status: Abnormal   Collection Time    08/15/12  7:47 AM      Result Value Range   Sodium 143  135 - 145 mEq/L   Potassium 3.5  3.5 - 5.1 mEq/L   Chloride 102  96 - 112 mEq/L   BUN 7  6 - 23 mg/dL   Creatinine, Ser 0.98  0.50 - 1.10 mg/dL   Glucose, Bld 119 (*) 70 - 99 mg/dL   Calcium, Ion 1.47  8.29 - 1.30 mmol/L   TCO2 27  0 - 100 mmol/L   Hemoglobin 12.6  12.0 - 15.0 g/dL   HCT 56.2  13.0 - 86.5 %    No results found.   A comprehensive review of systems was negative except for: Constitutional: positive for weight loss Eyes: positive for contacts/glasses  Blood pressure 189/75, pulse 74, temperature 98.1 F (36.7 C), temperature source Oral, resp. rate 18, height 5\' 5"  (1.651 m), weight 130 lb (58.968 kg), SpO2 96.00%.  General appearance: alert, cooperative and appears stated age Head: Normocephalic, without obvious abnormality, atraumatic Neck: supple, symmetrical, trachea midline Resp: clear to auscultation bilaterally Cardio: regular rate and rhythm GI: non tender Extremities: intact sensation and capillary refill all digits.  +epl/fpl/io.  dark streak on ulnar side of left thumb nail.  uniform color and width. Pulses:  2+ and symmetric Skin: as above Neurologic: Grossly normal Incision/Wound: na  Assessment/Plan Left thumb nail dark streak.  Non operative and operative treatment options were discussed with the patient and patient wishes to proceed with operative treatment. She wishes to have nail bed biopsy to evaluate for possible subungual melanoma.  Risks, benefits, and alternatives of surgery were discussed and the patient agrees with the plan of care.  Discussed possible nail deformity after procedure.  Lonnette Shrode R 08/15/2012, 8:26 AM

## 2012-08-15 NOTE — Op Note (Signed)
[id number] 

## 2012-08-15 NOTE — Anesthesia Preprocedure Evaluation (Addendum)
Anesthesia Evaluation  Patient identified by MRN, date of birth, ID band Patient awake    Reviewed: Allergy & Precautions, H&P , NPO status , Patient's Chart, lab work & pertinent test results, reviewed documented beta blocker date and time   Airway Mallampati: I TM Distance: >3 FB Neck ROM: Full    Dental  (+) Teeth Intact and Upper Dentures   Pulmonary  breath sounds clear to auscultation        Cardiovascular hypertension, Pt. on medications and Pt. on home beta blockers Rhythm:Regular Rate:Normal     Neuro/Psych    GI/Hepatic   Endo/Other  diabetes, Well Controlled, Type 2, Oral Hypoglycemic Agents  Renal/GU      Musculoskeletal   Abdominal   Peds  Hematology   Anesthesia Other Findings   Reproductive/Obstetrics                           Anesthesia Physical Anesthesia Plan  ASA: III  Anesthesia Plan: MAC and Bier Block   Post-op Pain Management:    Induction: Intravenous  Airway Management Planned: Simple Face Mask  Additional Equipment:   Intra-op Plan:   Post-operative Plan:   Informed Consent: I have reviewed the patients History and Physical, chart, labs and discussed the procedure including the risks, benefits and alternatives for the proposed anesthesia with the patient or authorized representative who has indicated his/her understanding and acceptance.   Dental advisory given  Plan Discussed with: CRNA, Anesthesiologist and Surgeon  Anesthesia Plan Comments:        Anesthesia Quick Evaluation

## 2012-08-15 NOTE — Brief Op Note (Signed)
08/15/2012  9:25 AM  PATIENT:  Derwood Kaplan [place]  79 y.o. female  PRE-OPERATIVE DIAGNOSIS:  LEFT THUMB MELANONYCHIA 177.6  POST-OPERATIVE DIAGNOSIS:  LEFT THUMB MELANONYCHIA 177.6  PROCEDURE:  Procedure(s): LEFT THUMB NAIL BED/FOLD BIOPSY  SURGEON:  Surgeon(s): Tami Ribas, MD  PHYSICIAN ASSISTANT:   ASSISTANTS: none   ANESTHESIA:   MAC  EBL:  Total I/O In: 700 [I.V.:700] Out: -   DRAINS: none   LOCAL MEDICATIONS USED:  MARCAINE    and XYLOCAINE   SPECIMEN:  Source of Specimen:  left thumb nail unit  DISPOSITION OF SPECIMEN:  PATHOLOGY  COUNTS:  YES  TOURNIQUET:   Total Tourniquet Time Documented: Forearm (Left) - 31 minutes Total: Forearm (Left) - 31 minutes   DICTATION: .Other Dictation: Dictation Number [id number]  PLAN OF CARE: Discharge to home after PACU

## 2012-08-15 NOTE — Transfer of Care (Signed)
Immediate Anesthesia Transfer of Care Note  Patient: Danielle Bright  Procedure(s) Performed: Procedure(s): LEFT THUMB NAIL BED/FOLD BIOPSY (Left)  Patient Location: PACU  Anesthesia Type:MAC  Level of Consciousness: awake and alert   Airway & Oxygen Therapy: Patient Spontanous Breathing and Patient connected to face mask oxygen  Post-op Assessment: Report given to PACU RN and Post -op Vital signs reviewed and stable  Post vital signs: Reviewed and stable  Complications: No apparent anesthesia complications

## 2012-08-15 NOTE — Anesthesia Postprocedure Evaluation (Signed)
  Anesthesia Post-op Note  Patient: Danielle Bright  Procedure(s) Performed: Procedure(s): LEFT THUMB NAIL BED/FOLD BIOPSY (Left)  Patient Location: PACU  Anesthesia Type:MAC  Level of Consciousness: awake, alert  and oriented  Airway and Oxygen Therapy: Patient Spontanous Breathing  Post-op Pain: none  Post-op Assessment: Post-op Vital signs reviewed  Post-op Vital Signs: Reviewed  Complications: No apparent anesthesia complications

## 2012-08-16 ENCOUNTER — Encounter (HOSPITAL_BASED_OUTPATIENT_CLINIC_OR_DEPARTMENT_OTHER): Payer: Self-pay | Admitting: Orthopedic Surgery

## 2017-10-17 ENCOUNTER — Inpatient Hospital Stay (HOSPITAL_COMMUNITY): Payer: Medicare Other

## 2017-10-17 ENCOUNTER — Inpatient Hospital Stay (HOSPITAL_COMMUNITY)
Admission: AD | Admit: 2017-10-17 | Discharge: 2017-10-23 | DRG: 445 | Disposition: A | Discharge status: Skilled Nursing Facility | Payer: Medicare Other | Source: Other Acute Inpatient Hospital | Attending: Internal Medicine | Admitting: Internal Medicine

## 2017-10-17 ENCOUNTER — Other Ambulatory Visit: Payer: Self-pay

## 2017-10-17 ENCOUNTER — Encounter (HOSPITAL_COMMUNITY): Payer: Self-pay | Admitting: General Practice

## 2017-10-17 DIAGNOSIS — Z7982 Long term (current) use of aspirin: Secondary | ICD-10-CM | POA: Diagnosis not present

## 2017-10-17 DIAGNOSIS — F419 Anxiety disorder, unspecified: Secondary | ICD-10-CM | POA: Diagnosis present

## 2017-10-17 DIAGNOSIS — Z85038 Personal history of other malignant neoplasm of large intestine: Secondary | ICD-10-CM | POA: Diagnosis not present

## 2017-10-17 DIAGNOSIS — E1140 Type 2 diabetes mellitus with diabetic neuropathy, unspecified: Secondary | ICD-10-CM | POA: Diagnosis present

## 2017-10-17 DIAGNOSIS — F0390 Unspecified dementia without behavioral disturbance: Secondary | ICD-10-CM | POA: Diagnosis present

## 2017-10-17 DIAGNOSIS — R7989 Other specified abnormal findings of blood chemistry: Secondary | ICD-10-CM

## 2017-10-17 DIAGNOSIS — D638 Anemia in other chronic diseases classified elsewhere: Secondary | ICD-10-CM | POA: Diagnosis present

## 2017-10-17 DIAGNOSIS — R945 Abnormal results of liver function studies: Secondary | ICD-10-CM | POA: Diagnosis not present

## 2017-10-17 DIAGNOSIS — Z794 Long term (current) use of insulin: Secondary | ICD-10-CM

## 2017-10-17 DIAGNOSIS — N184 Chronic kidney disease, stage 4 (severe): Secondary | ICD-10-CM | POA: Diagnosis not present

## 2017-10-17 DIAGNOSIS — K8033 Calculus of bile duct with acute cholangitis with obstruction: Secondary | ICD-10-CM | POA: Diagnosis not present

## 2017-10-17 DIAGNOSIS — K838 Other specified diseases of biliary tract: Secondary | ICD-10-CM | POA: Diagnosis not present

## 2017-10-17 DIAGNOSIS — R1011 Right upper quadrant pain: Secondary | ICD-10-CM

## 2017-10-17 DIAGNOSIS — Z90710 Acquired absence of both cervix and uterus: Secondary | ICD-10-CM | POA: Diagnosis not present

## 2017-10-17 DIAGNOSIS — K828 Other specified diseases of gallbladder: Secondary | ICD-10-CM | POA: Diagnosis present

## 2017-10-17 DIAGNOSIS — Z9049 Acquired absence of other specified parts of digestive tract: Secondary | ICD-10-CM | POA: Diagnosis not present

## 2017-10-17 DIAGNOSIS — E785 Hyperlipidemia, unspecified: Secondary | ICD-10-CM | POA: Diagnosis present

## 2017-10-17 DIAGNOSIS — E559 Vitamin D deficiency, unspecified: Secondary | ICD-10-CM | POA: Diagnosis present

## 2017-10-17 DIAGNOSIS — K819 Cholecystitis, unspecified: Secondary | ICD-10-CM | POA: Diagnosis not present

## 2017-10-17 DIAGNOSIS — E119 Type 2 diabetes mellitus without complications: Secondary | ICD-10-CM | POA: Diagnosis not present

## 2017-10-17 DIAGNOSIS — D509 Iron deficiency anemia, unspecified: Secondary | ICD-10-CM | POA: Diagnosis present

## 2017-10-17 DIAGNOSIS — N179 Acute kidney failure, unspecified: Secondary | ICD-10-CM

## 2017-10-17 DIAGNOSIS — E876 Hypokalemia: Secondary | ICD-10-CM | POA: Diagnosis present

## 2017-10-17 DIAGNOSIS — I129 Hypertensive chronic kidney disease with stage 1 through stage 4 chronic kidney disease, or unspecified chronic kidney disease: Secondary | ICD-10-CM | POA: Diagnosis present

## 2017-10-17 DIAGNOSIS — E1122 Type 2 diabetes mellitus with diabetic chronic kidney disease: Secondary | ICD-10-CM | POA: Diagnosis present

## 2017-10-17 DIAGNOSIS — E1169 Type 2 diabetes mellitus with other specified complication: Secondary | ICD-10-CM | POA: Diagnosis not present

## 2017-10-17 DIAGNOSIS — I10 Essential (primary) hypertension: Secondary | ICD-10-CM | POA: Diagnosis not present

## 2017-10-17 DIAGNOSIS — Z79899 Other long term (current) drug therapy: Secondary | ICD-10-CM | POA: Diagnosis not present

## 2017-10-17 DIAGNOSIS — R112 Nausea with vomiting, unspecified: Secondary | ICD-10-CM | POA: Diagnosis present

## 2017-10-17 LAB — CBC WITH DIFFERENTIAL/PLATELET
ABS IMMATURE GRANULOCYTES: 0.1 10*3/uL — AB (ref 0.00–0.07)
Basophils Absolute: 0 10*3/uL (ref 0.0–0.1)
Basophils Relative: 0 %
Eosinophils Absolute: 0 10*3/uL (ref 0.0–0.5)
Eosinophils Relative: 0 %
HEMATOCRIT: 26.6 % — AB (ref 36.0–46.0)
HEMOGLOBIN: 9.4 g/dL — AB (ref 12.0–15.0)
IMMATURE GRANULOCYTES: 1 %
LYMPHS ABS: 1.1 10*3/uL (ref 0.7–4.0)
LYMPHS PCT: 7 %
MCH: 25.9 pg — AB (ref 26.0–34.0)
MCHC: 35.3 g/dL (ref 30.0–36.0)
MCV: 73.3 fL — AB (ref 80.0–100.0)
MONO ABS: 1.1 10*3/uL — AB (ref 0.1–1.0)
MONOS PCT: 8 %
NEUTROS ABS: 12.9 10*3/uL — AB (ref 1.7–7.7)
NEUTROS PCT: 84 %
Platelets: 169 10*3/uL (ref 150–400)
RBC: 3.63 MIL/uL — ABNORMAL LOW (ref 3.87–5.11)
RDW: 14.1 % (ref 11.5–15.5)
WBC: 15.3 10*3/uL — ABNORMAL HIGH (ref 4.0–10.5)
nRBC: 0 % (ref 0.0–0.2)

## 2017-10-17 LAB — GLUCOSE, CAPILLARY
GLUCOSE-CAPILLARY: 65 mg/dL — AB (ref 70–99)
Glucose-Capillary: 105 mg/dL — ABNORMAL HIGH (ref 70–99)

## 2017-10-17 LAB — URINALYSIS, ROUTINE W REFLEX MICROSCOPIC
BACTERIA UA: NONE SEEN
BILIRUBIN URINE: NEGATIVE
Glucose, UA: NEGATIVE mg/dL
Ketones, ur: NEGATIVE mg/dL
Leukocytes, UA: NEGATIVE
NITRITE: NEGATIVE
Protein, ur: 30 mg/dL — AB
SPECIFIC GRAVITY, URINE: 1.012 (ref 1.005–1.030)
pH: 5 (ref 5.0–8.0)

## 2017-10-17 LAB — COMPREHENSIVE METABOLIC PANEL
ALT: 155 U/L — ABNORMAL HIGH (ref 0–44)
AST: 114 U/L — AB (ref 15–41)
Albumin: 3.4 g/dL — ABNORMAL LOW (ref 3.5–5.0)
Alkaline Phosphatase: 156 U/L — ABNORMAL HIGH (ref 38–126)
Anion gap: 12 (ref 5–15)
BILIRUBIN TOTAL: 5.5 mg/dL — AB (ref 0.3–1.2)
BUN: 49 mg/dL — AB (ref 8–23)
CO2: 19 mmol/L — ABNORMAL LOW (ref 22–32)
CREATININE: 3.75 mg/dL — AB (ref 0.44–1.00)
Calcium: 8.3 mg/dL — ABNORMAL LOW (ref 8.9–10.3)
Chloride: 107 mmol/L (ref 98–111)
GFR calc Af Amer: 11 mL/min — ABNORMAL LOW (ref >60)
GFR, EST NON AFRICAN AMERICAN: 10 mL/min — AB (ref >60)
Glucose, Bld: 106 mg/dL — ABNORMAL HIGH (ref 70–99)
POTASSIUM: 3.7 mmol/L (ref 3.5–5.1)
Sodium: 138 mmol/L (ref 135–145)
Total Protein: 7.1 g/dL (ref 6.5–8.1)

## 2017-10-17 LAB — LIPASE, BLOOD: LIPASE: 109 U/L — AB (ref 11–51)

## 2017-10-17 MED ORDER — CLONAZEPAM 0.25 MG PO TBDP
0.5000 mg | ORAL_TABLET | Freq: Two times a day (BID) | ORAL | Status: DC | PRN
  Filled 2017-10-17: qty 2

## 2017-10-17 MED ORDER — MORPHINE SULFATE (PF) 2 MG/ML IV SOLN
1.0000 mg | INTRAVENOUS | Status: DC | PRN
  Administered 2017-10-18: 1 mg via INTRAVENOUS
  Filled 2017-10-17: qty 1

## 2017-10-17 MED ORDER — LACTATED RINGERS IV SOLN
INTRAVENOUS | Status: DC
  Administered 2017-10-17: 19:00:00 via INTRAVENOUS

## 2017-10-17 MED ORDER — ONDANSETRON HCL 4 MG/2ML IJ SOLN: 4.0000 mg | Freq: Four times a day (QID) | INTRAMUSCULAR | Status: DC | PRN

## 2017-10-17 MED ORDER — PIPERACILLIN-TAZOBACTAM IN DEX 2-0.25 GM/50ML IV SOLN
2.2500 g | Freq: Three times a day (TID) | INTRAVENOUS | Status: DC
  Administered 2017-10-17 – 2017-10-23 (×17): 2.25 g via INTRAVENOUS
  Filled 2017-10-17 (×19): qty 50

## 2017-10-17 MED ORDER — HYDRALAZINE HCL 20 MG/ML IJ SOLN: 5.0000 mg | Freq: Four times a day (QID) | INTRAMUSCULAR | Status: DC | PRN

## 2017-10-17 MED ORDER — DEXTROSE-NACL 5-0.9 % IV SOLN
INTRAVENOUS | Status: DC
  Administered 2017-10-17 – 2017-10-19 (×3): via INTRAVENOUS

## 2017-10-17 MED ORDER — INSULIN ASPART 100 UNIT/ML ~~LOC~~ SOLN
0.0000 [IU] | Freq: Three times a day (TID) | SUBCUTANEOUS | Status: DC
  Administered 2017-10-19: 4 [IU] via SUBCUTANEOUS
  Administered 2017-10-20: 3 [IU] via SUBCUTANEOUS
  Administered 2017-10-21: 2 [IU] via SUBCUTANEOUS

## 2017-10-17 NOTE — H&P (Addendum)
History and Physical  [name]:[id number] DOB: 1927-07-26 DOA: 10/17/2017  Referring physician: EDP PCP: [name], MD   Chief Complaint: ab pain, n/v  HPI: Danielle Bright is a 84 y.o. female   With h/o colon cancer s/p right hemicolectomy 97yrs ago,  HTN, insulin dependent DM2, CKDIII, mild dementia on Namenda who has been independent presented to [facility] ED due to n/v, ab pain since Sunday, she reports not able to keep anything down. She denies fever. Reports stool have been dark for a few months.  CT AB  Showed marked distension of gallbladder with a possible 0.3cm stone at the ampullar of vater. cbd dialated at 1.1cm, Multiple calcifications throughout pancreatis duct and in the parenchyma of the pancrease. Labs, wbc 16, hgb 10.3, bun 55, cr 3.70, tbili 4.8. She received 2liter ns and zosyn, EDP contacted [facility] who accepted patient to transfer to [place]. hospitalist to admit, [facility] to consult.   Currently patient denies pain, no active n/v. No fever.  Review of Systems:  Detail per HPI, Review of systems are otherwise negative  Past Medical History:  Diagnosis Date  . Arthritis   . Cancer ([place])    colon  . Diabetes mellitus without complication ([place])   . Hypertension   . Wears dentures    full top,lower partial  . Wears glasses    Past Surgical History:  Procedure Laterality Date  . ABDOMINAL HYSTERECTOMY    . COLONOSCOPY     x3  . NAILBED REPAIR Left 08/15/2012   Procedure: LEFT THUMB NAIL BED/FOLD BIOPSY;  Surgeon: Tennis Must, MD;  Location: [facility];  Service: Orthopedics;  Laterality: Left;  . PARTIAL COLECTOMY  2003   small bowel and part colon resection-cancer   Social History:  reports that she has never smoked. She has never used smokeless tobacco. She reports that she does not drink alcohol or use drugs. Patient lives at home & is able to participate in activities of daily living independently   No Known  Allergies  History reviewed. No pertinent family history.    Prior to Admission medications   Medication Sig Start Date End Date Taking? Authorizing Provider  acetaminophen (TYLENOL) 325 MG tablet Take 650 mg by mouth every 6 (six) hours as needed for mild pain.   Yes [provider]  amLODipine (NORVASC) 5 MG tablet Take 5 mg by mouth daily.  07/05/11  Yes [provider]  aspirin EC 81 MG tablet Take 81 mg by mouth daily.   Yes [provider]  BYSTOLIC 20 MG TABS 20 mg every morning.  05/15/11  Yes [provider]  Cholecalciferol (VITAMIN D3) 5000 units TABS Take 5,000 Units by mouth once a week. mondays   Yes [provider]  clonazePAM (KLONOPIN) 0.5 MG disintegrating tablet Take 0.5 mg by mouth 2 (two) times daily as needed.    Yes [provider]  gabapentin (NEURONTIN) 100 MG capsule Take 100 mg by mouth 3 (three) times daily. 10/09/17  Yes [provider]  hydrochlorothiazide (HYDRODIURIL) 25 MG tablet Take 25 mg by mouth daily.    Yes [provider]  LEVEMIR FLEXPEN 100 UNIT/ML injection 30 Units every evening.  07/04/11  Yes [provider]  memantine (NAMENDA) 5 MG tablet Take 5 mg by mouth 2 (two) times daily. 08/07/17  Yes [provider]  mirtazapine (REMERON) 15 MG tablet Take 15 mg by mouth at bedtime. 09/01/17  Yes [provider]  ramipril (ALTACE) 10 MG capsule Take 10 mg by mouth daily.  06/09/11  Yes [provider]  sertraline (ZOLOFT) 50 MG tablet Take 50 mg by mouth at bedtime.  05/03/11  Yes [provider]  TRESIBA FLEXTOUCH 100 UNIT/ML SOPN FlexTouch Pen Inject 50 Units into the skin at bedtime. 09/25/17  Yes [provider]  HYDROcodone-acetaminophen ([place]) 5-325 MG per tablet 1-2 tabs po q6 hours prn pain Patient not taking: Reported on 10/17/2017 08/15/12   Leanora Cover, MD    Physical Exam: BP (!) 152/75 (BP Location: Left Arm)   Pulse 66   Temp  98.3 F (36.8 C) (Oral)   Resp 16   Ht 5\' 6"  (1.676 m)   Wt 59.5 kg   SpO2 100%   BMI 21.17 kg/m   General:  Pleasant, aaox3, does has mild impaired memory Eyes: PERRL ENT: unremarkable Neck: supple, no JVD Cardiovascular: RRR Respiratory: CTABL Abdomen: soft/NT/ND, positive bowel sounds Skin: no rash Musculoskeletal:  No edema Psychiatric: calm/cooperative Neurologic: no focal findings            Labs on Admission:  Basic Metabolic Panel: No results for input(s): NA, K, CL, CO2, GLUCOSE, BUN, CREATININE, CALCIUM, MG, PHOS in the last 168 hours. Liver Function Tests: No results for input(s): AST, ALT, ALKPHOS, BILITOT, PROT, ALBUMIN in the last 168 hours. No results for input(s): LIPASE, AMYLASE in the last 168 hours. No results for input(s): AMMONIA in the last 168 hours. CBC: No results for input(s): WBC, NEUTROABS, HGB, HCT, MCV, PLT in the last 168 hours. Cardiac Enzymes: No results for input(s): CKTOTAL, CKMB, CKMBINDEX, TROPONINI in the last 168 hours.  BNP (last 3 results) No results for input(s): BNP in the last 8760 hours.  ProBNP (last 3 results) No results for input(s): PROBNP in the last 8760 hours.  CBG: No results for input(s): GLUCAP in the last 168 hours.  Radiological Exams on Admission: No results found.  EKG: Independently reviewed. Sinus bradycardia, heart rate 58, no acute st/t changes. Qtc 454  Assessment/Plan Present on Admission: **None**  Obstructive stone in ampullar of valter with n/v and abdominal pain -marked enlarged gallbladder, CBD 1.1 cm, though stone though to be from pancreatic duct -continue hydration, continue zosyn, analgesics and antiemetics -case discussed with [facility] Dr Tarri Glenn who recommend mrcp tonight, [facility] will see patient in am. mrcp without contrast ordered ( due to renal impairment) -further management per [facility]   AKI on CKDIII -she denies urinary symptom, no obstructive nephropathy by CT scan -likely prerenal,  she has not been able to keep anything down since Sunday -continue hydration, renal dosing meds  Anemia: hgb 12 in 2014, hgb 10 today She reports has been having dark stool for several months, with remote history of colon cancer (s/o right hemicolectomy 45yrs ago, she denies h/o XRT or chemo) Anemia work up including, fobt, retic, indirect bili, b12, folate, tsh Repeat cbc in am  Insulin dependent dm2 Hold long acting insulin Start on ssi/hypoglycemia protocol  HTN Hold oral bp meds Prn hydralazine for now  Impaired memory, early dementia Resume namenda when able to take oral meds consistently    DVT prophylaxis: scd's  Consultants: case discussed with [facility] Dr Tarri Glenn over the phone  Code Status: full   Family Communication:  Patient   Disposition Plan: admit to med surg  Time spent: 25mins  Florencia Reasons MD, PhD [facility] Pager [phone] If 7PM-7AM, please contact night-coverage at [email or web address], password [facility]

## 2017-10-17 NOTE — Progress Notes (Signed)
Pharmacy Antibiotic Note  Danielle Bright is a 84 y.o. female admitted on 10/17/2017 with Intra-abdominal infection.  Pharmacy has been consulted for Zosyn dosing. Patient received Zosyn 3.375 g IV x 1 dose at [facility] @ 1209, prior to transfer.   Plan: Zosyn 2.25g IV q8h (30 minute infusion).  Monitor clinical progress, cultures/sensitivities, renal function, abx plan   Height: 5\' 6"  (167.6 cm) Weight: 131 lb 2.8 oz (59.5 kg) IBW/kg (Calculated) : 59.3  Temp (24hrs), Avg:98.3 F (36.8 C), Min:98.3 F (36.8 C), Max:98.3 F (36.8 C)  Recent Labs  Lab 10/17/17 1827  WBC 15.3*  CREATININE 3.75*    Estimated Creatinine Clearance: 9.7 mL/min (A) (by C-G formula based on SCr of 3.75 mg/dL (H)).    No Known Allergies  Antimicrobials this admission: 10/16 Zosyn >>   Dose adjustments this admission:  Microbiology results:   Thank you for allowing [place] to participate in this patients care.   Jens Som, PharmD Please utilize Amion (under [place]) for appropriate number for your unit pharmacist. 10/17/2017 6:26 PM

## 2017-10-17 NOTE — Progress Notes (Signed)
Danielle Bright is a 84 y.o. female patient admitted in 6N05 form [facility]  via stretcher transported by [employer].  Awake, alert - oriented  X 4 - no acute distress noted.  VSS - Blood pressure (!) 152/75, pulse 66, temperature 98.3 F (36.8 C), temperature source Oral, resp. rate 16, height 5\' 6"  (1.676 m), weight 59.5 kg, SpO2 100 %.    IV in place, occlusive dsg intact without redness.  Orientation to room, and floor completed.  Admission INP armband ID verified with patient/family, and in place.   SR up x 2, fall assessment complete, with patient and family able to verbalize understanding of risk associated with falls, and verbalized understanding to call nsg before up out of bed.  Call light within reach, patient able to voice, and demonstrate understanding. No evidence of skin break down noted on exam.  Admission nurse notified of admission. MD notified.    Will cont to eval and treat per MD orders.  Roger Kill, RN 10/17/2017 5:39 PM

## 2017-10-18 ENCOUNTER — Inpatient Hospital Stay (HOSPITAL_COMMUNITY): Payer: Medicare Other

## 2017-10-18 DIAGNOSIS — K819 Cholecystitis, unspecified: Secondary | ICD-10-CM

## 2017-10-18 DIAGNOSIS — I10 Essential (primary) hypertension: Secondary | ICD-10-CM

## 2017-10-18 DIAGNOSIS — F0390 Unspecified dementia without behavioral disturbance: Secondary | ICD-10-CM

## 2017-10-18 DIAGNOSIS — K838 Other specified diseases of biliary tract: Principal | ICD-10-CM

## 2017-10-18 DIAGNOSIS — E1169 Type 2 diabetes mellitus with other specified complication: Secondary | ICD-10-CM

## 2017-10-18 LAB — CBC WITH DIFFERENTIAL/PLATELET
Abs Immature Granulocytes: 0.09 10*3/uL — ABNORMAL HIGH (ref 0.00–0.07)
BASOS ABS: 0 10*3/uL (ref 0.0–0.1)
Basophils Relative: 0 %
EOS ABS: 0 10*3/uL (ref 0.0–0.5)
Eosinophils Relative: 0 %
HCT: 26.2 % — ABNORMAL LOW (ref 36.0–46.0)
Hemoglobin: 9 g/dL — ABNORMAL LOW (ref 12.0–15.0)
Immature Granulocytes: 1 %
LYMPHS ABS: 0.9 10*3/uL (ref 0.7–4.0)
LYMPHS PCT: 6 %
MCH: 25.3 pg — ABNORMAL LOW (ref 26.0–34.0)
MCHC: 34.4 g/dL (ref 30.0–36.0)
MCV: 73.6 fL — AB (ref 80.0–100.0)
Monocytes Absolute: 1.1 10*3/uL — ABNORMAL HIGH (ref 0.1–1.0)
Monocytes Relative: 7 %
NRBC: 0 % (ref 0.0–0.2)
Neutro Abs: 12.2 10*3/uL — ABNORMAL HIGH (ref 1.7–7.7)
Neutrophils Relative %: 86 %
PLATELETS: 168 10*3/uL (ref 150–400)
RBC: 3.56 MIL/uL — ABNORMAL LOW (ref 3.87–5.11)
RDW: 14 % (ref 11.5–15.5)
WBC: 14.2 10*3/uL — ABNORMAL HIGH (ref 4.0–10.5)

## 2017-10-18 LAB — COMPREHENSIVE METABOLIC PANEL
ALBUMIN: 3.2 g/dL — AB (ref 3.5–5.0)
ALT: 140 U/L — AB (ref 0–44)
AST: 102 U/L — ABNORMAL HIGH (ref 15–41)
Alkaline Phosphatase: 150 U/L — ABNORMAL HIGH (ref 38–126)
Anion gap: 13 (ref 5–15)
BILIRUBIN TOTAL: 5.3 mg/dL — AB (ref 0.3–1.2)
BUN: 44 mg/dL — ABNORMAL HIGH (ref 8–23)
CO2: 18 mmol/L — AB (ref 22–32)
CREATININE: 3.5 mg/dL — AB (ref 0.44–1.00)
Calcium: 8.4 mg/dL — ABNORMAL LOW (ref 8.9–10.3)
Chloride: 108 mmol/L (ref 98–111)
GFR calc Af Amer: 12 mL/min — ABNORMAL LOW (ref >60)
GFR calc non Af Amer: 11 mL/min — ABNORMAL LOW (ref >60)
GLUCOSE: 108 mg/dL — AB (ref 70–99)
Potassium: 3.4 mmol/L — ABNORMAL LOW (ref 3.5–5.1)
SODIUM: 139 mmol/L (ref 135–145)
TOTAL PROTEIN: 6.8 g/dL (ref 6.5–8.1)

## 2017-10-18 LAB — RETICULOCYTES
Immature Retic Fract: 12.9 % (ref 2.3–15.9)
RBC.: 3.56 MIL/uL — ABNORMAL LOW (ref 3.87–5.11)
Retic Count, Absolute: 43.8 10*3/uL (ref 19.0–186.0)
Retic Ct Pct: 1.2 % (ref 0.4–3.1)

## 2017-10-18 LAB — FOLATE: FOLATE: 16.1 ng/mL (ref >5.9)

## 2017-10-18 LAB — TSH: TSH: 0.339 u[IU]/mL — AB (ref 0.350–4.500)

## 2017-10-18 LAB — HEMOGLOBIN A1C
Hgb A1c MFr Bld: 8.5 % — ABNORMAL HIGH (ref 4.8–5.6)
Mean Plasma Glucose: 197.25 mg/dL

## 2017-10-18 LAB — BILIRUBIN, DIRECT: Bilirubin, Direct: 3.8 mg/dL — ABNORMAL HIGH (ref 0.0–0.2)

## 2017-10-18 LAB — GLUCOSE, CAPILLARY
GLUCOSE-CAPILLARY: 126 mg/dL — AB (ref 70–99)
GLUCOSE-CAPILLARY: 81 mg/dL (ref 70–99)
Glucose-Capillary: 128 mg/dL — ABNORMAL HIGH (ref 70–99)

## 2017-10-18 LAB — VITAMIN B12: VITAMIN B 12: 160 pg/mL — AB (ref 180–914)

## 2017-10-18 MED ORDER — POTASSIUM CHLORIDE 10 MEQ/100ML IV SOLN
10.0000 meq | INTRAVENOUS | Status: AC
  Administered 2017-10-18 (×4): 10 meq via INTRAVENOUS
  Filled 2017-10-18 (×4): qty 100

## 2017-10-18 MED ORDER — TECHNETIUM TC 99M MEBROFENIN IV KIT
8.0600 | PACK | Freq: Once | INTRAVENOUS | Status: AC | PRN
  Administered 2017-10-18: 8.06 via INTRAVENOUS

## 2017-10-18 MED ORDER — MORPHINE SULFATE (PF) 4 MG/ML IV SOLN
INTRAVENOUS | Status: AC
  Filled 2017-10-18: qty 1

## 2017-10-18 MED ORDER — MORPHINE SULFATE (PF) 4 MG/ML IV SOLN
2.4000 mg | Freq: Once | INTRAVENOUS | Status: AC
  Administered 2017-10-18: 2.4 mg via INTRAVENOUS

## 2017-10-18 NOTE — Consult Note (Addendum)
[facility] Consult: 9:48 AM 10/18/2017  LOS: 1 day    Referring Provider: Dr Erlinda Hong  Primary Care Physician:  [name], MD Primary Gastroenterologist:  Dr. Oletta Lamas in 2002    Reason for Consultation:  Dilated bile duct, ? Choledocholithiasis.     HPI: Danielle Bright is a 84 y.o. female.  PMH T2N0 colon cancer, ileal transverse colectomy 04/2001 in [facility], required repeat ex lap/LOA/ SB resection/reanastomosis ileum to colon for post op SBO.  HTN.  IDDM.  DKA 09/2016.  Diabetic neuropathy.  Microcytic anemia.  CKD stage IV. Vit D deficiency. HLD.  Anxiety. Dementia ? (on Namenda).  S/p hysterectomy in 1980s.   08/2012  Colonoscopy. Dr Marian Sorrow at [facility].  "unremarkable" "normal mucosa" per note.     Started N/V, right sided abd pain after church on Sunday.  This continued through yesterday.  She had a visit with her PMD scheduled yesterday but decided to go to the [facility] ED instead.  Ultrasound: 11 mm CBD.  GB distended and mild wall thickening with pericholecystic fluid, + sono [place], concern for acaluculous cholecystitis.   CT scan with distended GB, 1.1 cm CBD, ? Stone at [facility]  Pancreatic duct calcifications.   t bili 5.6.  Alk phos 187.  AST/ALT 189/214.   WBCs 16 BUN/Creat 55/3.7  Transfer to [facility] at pt request.  Pain persists.  The morphine provides limited relief.  No further nausea or vomiting.  Last bowel movement was Saturday.  She denies having seen dark stools recently, no blood per rectum. t bili 5.5 >> 5.3.  Alk phos 156 >> 150.  ASTALT 114/155 >> 102/140. Lipase 109.  AKI with GFR 12 (stage 5 CKD).   WBCs 15.3 >> 14.2.   Hgb 9, MCV 73 (baseline 10.5, 78) MRCP: Gallbladder distended with edema in the gallbladder wall and mild pericholecystic fluid.  CBD 1.3 cm but no evidence of  CBD stone.  Multiple calcifications in the main pancreatic duct.  The largest is 9 mm at the tail, there is also a 7 mm stone at the mid tail.  Stomach moderately distended.  Soft tissue stranding and free fluid surrounding the Enterocolonic anastomosis.  Free fluid/soft tissue stranding in right upper quadrant, could reflect inflammatory changes in the setting of acute cholecystitis.  Nonspecific tiny peritoneal nodules.  Patient lives alone in [place].  She has a daughter in [place] and a daughter in [place].  An assistant comes by 3 days a week to help her with housework and cooking but she is independent for showering and self-care. She occasionally drinks wine. Family history negative for colon cancer; stomach, liver, gallbladder problems.   Past Medical History:  Diagnosis Date  . Arthritis   . Cancer ([place])    colon  . Diabetes mellitus without complication ([place])   . Hypertension   . Wears dentures    full top,lower partial  . Wears glasses     Past Surgical History:  Procedure Laterality Date  . ABDOMINAL HYSTERECTOMY    . COLONOSCOPY  x3  . NAILBED REPAIR Left 08/15/2012   Procedure: LEFT THUMB NAIL BED/FOLD BIOPSY;  Surgeon: Tennis Must, MD;  Location: [facility];  Service: Orthopedics;  Laterality: Left;  . PARTIAL COLECTOMY  2003   small bowel and part colon resection-cancer    Prior to Admission medications   Medication Sig Start Date End Date Taking? Authorizing Provider  acetaminophen (TYLENOL) 325 MG tablet Take 650 mg by mouth every 6 (six) hours as needed for mild pain.   Yes [provider]  amLODipine (NORVASC) 5 MG tablet Take 5 mg by mouth daily.  07/05/11  Yes [provider]  aspirin EC 81 MG tablet Take 81 mg by mouth daily.   Yes [provider]  BYSTOLIC 20 MG TABS 20 mg every morning.  05/15/11  Yes [provider]  Cholecalciferol (VITAMIN D3) 5000 units TABS Take 5,000 Units by mouth once a  week. mondays   Yes [provider]  clonazePAM (KLONOPIN) 0.5 MG disintegrating tablet Take 0.5 mg by mouth 2 (two) times daily as needed.    Yes [provider]  gabapentin (NEURONTIN) 100 MG capsule Take 100 mg by mouth 3 (three) times daily. 10/09/17  Yes [provider]  hydrochlorothiazide (HYDRODIURIL) 25 MG tablet Take 25 mg by mouth daily.    Yes [provider]  LEVEMIR FLEXPEN 100 UNIT/ML injection 30 Units every evening.  07/04/11  Yes [provider]  memantine (NAMENDA) 5 MG tablet Take 5 mg by mouth 2 (two) times daily. 08/07/17  Yes [provider]  mirtazapine (REMERON) 15 MG tablet Take 15 mg by mouth at bedtime. 09/01/17  Yes [provider]  ramipril (ALTACE) 10 MG capsule Take 10 mg by mouth daily.  06/09/11  Yes [provider]  sertraline (ZOLOFT) 50 MG tablet Take 50 mg by mouth at bedtime.  05/03/11  Yes [provider]  TRESIBA FLEXTOUCH 100 UNIT/ML SOPN FlexTouch Pen Inject 50 Units into the skin at bedtime. 09/25/17  Yes [provider]  HYDROcodone-acetaminophen (NORCO) 5-325 MG per tablet 1-2 tabs po q6 hours prn pain Patient not taking: Reported on 10/17/2017 08/15/12   Leanora Cover, MD    Scheduled Meds: . insulin aspart  0-15 Units Subcutaneous TID WC   Infusions: . dextrose 5 % and 0.9% NaCl 75 mL/hr at 10/18/17 0811  . piperacillin-tazobactam (ZOSYN)  IV 100 mL/hr at 10/18/17 0811   PRN Meds: clonazePAM, hydrALAZINE, morphine injection, ondansetron (ZOFRAN) IV   Allergies as of 10/17/2017  . (No Known Allergies)    History reviewed. No pertinent family history.  Social History   Socioeconomic History  . Marital status: Married    Spouse name: Not on file  . Number of children: Not on file  . Years of education: Not on file  . Highest education level: Not on file  Occupational History  . Not on file  Social Needs  . Financial resource strain: Not on file  . Food  insecurity:    Worry: Not on file    Inability: Not on file  . Transportation needs:    Medical: Not on file    Non-medical: Not on file  Tobacco Use  . Smoking status: Never Smoker  . Smokeless tobacco: Never Used  Substance and Sexual Activity  . Alcohol use: No  . Drug use: No  . Sexual activity: Not on file  Lifestyle  . Physical activity:    Days per week: Not on file  Minutes per session: Not on file  . Stress: Not on file  Relationships  . Social connections:    Talks on phone: Not on file    Gets together: Not on file    Attends religious service: Not on file    Active member of club or organization: Not on file    Attends meetings of clubs or organizations: Not on file    Relationship status: Not on file  . Intimate partner violence:    Fear of current or ex partner: Not on file    Emotionally abused: Not on file    Physically abused: Not on file    Forced sexual activity: Not on file  Other Topics Concern  . Not on file  Social History Narrative  . Not on file    REVIEW OF SYSTEMS: Constitutional: Fatigue and weakness since the symptoms began.  Otherwise not a problem. ENT:  No nose bleeds Pulm: Denies cough or shortness of breath.  No pleuritic pain. CV:  No palpitations, no LE edema.  No chest pain GU:  No hematuria, no frequency Renal: Followed every several months by nephrologist at [facility].  There is been no mentions in the notes of dialysis. GI: Per HPI. Heme: No unusual or excessive bleeding or bruising.  Was prescribed iron decades ago but none recently and no Epogen/erythropoietin prescribed. Transfusions: None Neuro:  No headaches, no peripheral tingling or numbness Derm:  No itching, no rash or sores.  Endocrine:  No sweats or chills.  No polyuria or dysuria Immunization: Not queried. Travel:  None beyond local counties in last few months.    PHYSICAL EXAM: Vital signs in last 24 hours: Vitals:   10/18/17 0206 10/18/17 0520  BP: (!) 157/69  (!) 165/71  Pulse: 62 63  Resp: 18 17  Temp: 98.2 F (36.8 C) 98.3 F (36.8 C)  SpO2: 95% 95%   Wt Readings from Last 3 Encounters:  10/17/17 59.5 kg  08/15/12 59 kg  07/17/11 59.9 kg    General: Pleasant, thin, comfortable, nontoxic AAF. Head: No facial asymmetry Eyes: No scleral icterus.  No conjunctival pallor.  EOMI. Ears: Hard of hearing Nose: No congestion or discharge Mouth: The bulk of her teeth are absent.  There are none in the upper jaw, her dentures are at home.  About 8 or 10 teeth in the lower jaw.  Mucosa is moist and clear.  Tongue is midline. Neck: No JVD, no masses, no thyromegaly Lungs: Clear bilaterally with excellent breath sounds no labored breathing. Heart: RRR.  No MRG.  S1, S2 present Abdomen: Soft.  Tenderness in the right abdomen without guarding or rebound..   Rectal: Deferred Musc/Skeltl: No joint swelling, redness or deformity other than some kyphosis of the spine. Extremities: No CCE.  Limbs are thin. Neurologic: Alert.  Oriented x3.  Appropriate, speech fluid.  Good historian. Skin: No rash, no sores, no suspicious lesions, no jaundice. Tattoos: None Nodes: No cervical adenopathy. Psych: Pleasant, cooperative, calm.  Intake/Output from previous day: 10/16 0701 - 10/17 0700 In: -  Out: 500 [Urine:500] Intake/Output this shift: Total I/O In: 760.3 [I.V.:660.3; IV Piggyback:100] Out: 300 [Urine:300]  LAB RESULTS: Recent Labs    10/17/17 1827 10/18/17 0217  WBC 15.3* 14.2*  HGB 9.4* 9.0*  HCT 26.6* 26.2*  PLT 169 168   BMET Lab Results  Component Value Date   NA 139 10/18/2017   NA 138 10/17/2017   NA 143 08/15/2012   K 3.4 (L) 10/18/2017  K 3.7 10/17/2017   K 3.5 08/15/2012   CL 108 10/18/2017   CL 107 10/17/2017   CL 102 08/15/2012   CO2 18 (L) 10/18/2017   CO2 19 (L) 10/17/2017   CO2 27 07/17/2011   GLUCOSE 108 (H) 10/18/2017   GLUCOSE 106 (H) 10/17/2017   GLUCOSE 155 (H) 08/15/2012   BUN 44 (H) 10/18/2017   BUN 49  (H) 10/17/2017   BUN 7 08/15/2012   CREATININE 3.50 (H) 10/18/2017   CREATININE 3.75 (H) 10/17/2017   CREATININE 1.00 08/15/2012   CALCIUM 8.4 (L) 10/18/2017   CALCIUM 8.3 (L) 10/17/2017   CALCIUM 10.1 07/17/2011   LFT Recent Labs    10/17/17 1827 10/18/17 0217  PROT 7.1 6.8  ALBUMIN 3.4* 3.2*  AST 114* 102*  ALT 155* 140*  ALKPHOS 156* 150*  BILITOT 5.5* 5.3*  BILIDIR  --  3.8*   PT/INR No results found for: INR, PROTIME Hepatitis Panel No results for input(s): HEPBSAG, HCVAB, HEPAIGM, HEPBIGM in the last 72 hours. C-Diff No components found for: CDIFF Lipase     Component Value Date/Time   LIPASE 109 (H) 10/17/2017 1827    Drugs of Abuse  No results found for: LABOPIA, COCAINSCRNUR, LABBENZ, AMPHETMU, THCU, LABBARB   RADIOLOGY STUDIES: Mr Abdomen Mrcp Wo Contrast  Result Date: 10/18/2017 CLINICAL DATA:  Abnormal liver function test. History of colon cancer status post right hemicolectomy. Gallbladder distension without gallstone. EXAM: MRI ABDOMEN WITHOUT CONTRAST  (INCLUDING MRCP) TECHNIQUE: Multiplanar multisequence MR imaging of the abdomen was performed. Heavily T2-weighted images of the biliary and pancreatic ducts were obtained, and three-dimensional MRCP images were rendered by post processing. COMPARISON:  CT 10/17/2017 and ultrasound 10/17/2017 FINDINGS: Lower chest: No acute findings. Hepatobiliary: There is no focal liver abnormality identified. The gallbladder appears markedly distended measuring approximately 7.3 by 6.2 by 7.2 cm (volume = 170 cm^3). Mild diffuse gallbladder wall thickening is identified with mild pericholecystic fluid. Increase caliber of the common bile duct measures up to 1.3 cm. Sludge within the common bile duct is identified but no choledocholithiasis. Pancreas: There is diffuse main duct dilatation with a large calcification identified within the main duct within the mid tail measuring 7 mm. Multiple calcifications are identified  throughout the main duct. The largest is in the tail of pancreas measuring up to 9 mm, image 14/12. No peripancreatic fat stranding or free fluid identified. Spleen:  Within normal limits in size and appearance. Adrenals/Urinary Tract: Normal adrenal glands. Bilateral kidney cysts are identified. The largest is in the inferior pole of right kidney measuring 1.4 cm. Stomach/Bowel: Moderate distension of the stomach. The visualized proximal and mid small bowel loops are unremarkable. The patient is status post right hemicolectomy. Enterocolonic anastomosis within the right upper quadrant of the abdomen is identified. There is soft tissue stranding and free fluid surrounding the entero colonic anastomosis. Vascular/Lymphatic: Normal appearance of the abdominal aorta. No enlarged retroperitoneal lymph nodes. Other: There is a small volume of ascites extending over the right lobe of liver. There is a tiny peritoneal nodule overlying the lateral segment of left lobe of liver, image 15/5. Small soft tissue nodule adjacent to scratch set small peritoneal nodule adjacent to the gastric antrum is noted, image 31/5. Musculoskeletal: No suspicious bone lesions identified. IMPRESSION: 1. Again seen is diffuse gallbladder distension, gallbladder wall edema and mild pericholecystic fluid. No gallstones identified. Cannot exclude acute acalculous cholecystitis. 2. Increase caliber of the common bile duct without choledocholithiasis or obstructing mass. 3. There is  abnormal free fluid and soft tissue stranding in the right upper quadrant of the abdomen. In the setting of acute cholecystitis this likely reflects inflammatory changes. 4. Two tiny peritoneal nodules are identified, nonspecific. In a patient who has a history of colon cancer clinical correlation is advised within additional follow-up imaging in 3-6 months to ensure stability of these lesions to exclude recurrent tumor/peritoneal disease. Electronically Signed   By:  Kerby Moors M.D.   On: 10/18/2017 09:39   Mr 3d Recon At Scanner  Result Date: 10/18/2017 CLINICAL DATA:  Abnormal liver function test. History of colon cancer status post right hemicolectomy. Gallbladder distension without gallstone. EXAM: MRI ABDOMEN WITHOUT CONTRAST  (INCLUDING MRCP) TECHNIQUE: Multiplanar multisequence MR imaging of the abdomen was performed. Heavily T2-weighted images of the biliary and pancreatic ducts were obtained, and three-dimensional MRCP images were rendered by post processing. COMPARISON:  CT 10/17/2017 and ultrasound 10/17/2017 FINDINGS: Lower chest: No acute findings. Hepatobiliary: There is no focal liver abnormality identified. The gallbladder appears markedly distended measuring approximately 7.3 by 6.2 by 7.2 cm (volume = 170 cm^3). Mild diffuse gallbladder wall thickening is identified with mild pericholecystic fluid. Increase caliber of the common bile duct measures up to 1.3 cm. Sludge within the common bile duct is identified but no choledocholithiasis. Pancreas: There is diffuse main duct dilatation with a large calcification identified within the main duct within the mid tail measuring 7 mm. Multiple calcifications are identified throughout the main duct. The largest is in the tail of pancreas measuring up to 9 mm, image 14/12. No peripancreatic fat stranding or free fluid identified. Spleen:  Within normal limits in size and appearance. Adrenals/Urinary Tract: Normal adrenal glands. Bilateral kidney cysts are identified. The largest is in the inferior pole of right kidney measuring 1.4 cm. Stomach/Bowel: Moderate distension of the stomach. The visualized proximal and mid small bowel loops are unremarkable. The patient is status post right hemicolectomy. Enterocolonic anastomosis within the right upper quadrant of the abdomen is identified. There is soft tissue stranding and free fluid surrounding the entero colonic anastomosis. Vascular/Lymphatic: Normal appearance  of the abdominal aorta. No enlarged retroperitoneal lymph nodes. Other: There is a small volume of ascites extending over the right lobe of liver. There is a tiny peritoneal nodule overlying the lateral segment of left lobe of liver, image 15/5. Small soft tissue nodule adjacent to scratch set small peritoneal nodule adjacent to the gastric antrum is noted, image 31/5. Musculoskeletal: No suspicious bone lesions identified. IMPRESSION: 1. Again seen is diffuse gallbladder distension, gallbladder wall edema and mild pericholecystic fluid. No gallstones identified. Cannot exclude acute acalculous cholecystitis. 2. Increase caliber of the common bile duct without choledocholithiasis or obstructing mass. 3. There is abnormal free fluid and soft tissue stranding in the right upper quadrant of the abdomen. In the setting of acute cholecystitis this likely reflects inflammatory changes. 4. Two tiny peritoneal nodules are identified, nonspecific. In a patient who has a history of colon cancer clinical correlation is advised within additional follow-up imaging in 3-6 months to ensure stability of these lesions to exclude recurrent tumor/peritoneal disease. Electronically Signed   By: Kerby Moors M.D.   On: 10/18/2017 09:39     IMPRESSION:   *   Dilated CBD without choledocholithiasis.  Calcifications, 2 of which are quite large in the dilated main pancreatic duct. Suspicion for cholecystitis.  On Zosyn.    *   AKI, baseline CKD stage 4.    *   Microcytic anemia.  No blood per rectum, no tarry stools, patient denies dark stools which were mentioned in 1 of the notes.  *    IDDM.  Blood sugar in the 60s this morning, now up to 128 after switching IV fluids to D5 1/2 NS.   *    Colon cancer resected in 2003.  Last colonoscopy 2014 normal. Nonspecific, tiny peritoneal nodules seen on MR, suggestion is repeat imaging in 3 to 6 months to ensure that these are stable and exclude recurrent tumor/peritoneal  disease.   PLAN:     *   Given the question of cholecystitis, patient should be evaluated by general surgery. We will reach out to them.    *    Case with [name].  No role for ERCP at present.  Will have Dr Rush Landmark review imaging and case and advise mgt of the pancreatic duct calcifications.    *   From GI perspective ok to have clears or full liquids but may want to hold off til surgery sees her as they may want HIDA.    Azucena Freed  10/18/2017, 9:48 AM Phone [phone]

## 2017-10-18 NOTE — Progress Notes (Signed)
PROGRESS NOTE    Danielle Bright  IRW:[id number] DOB: 07-Apr-1927 DOA: 10/17/2017 PCP: [name], MD   Brief Narrative:  HPI on 10/17/2017 by Dr. Florencia Reasons CHOSEN Danielle Bright is a 84 y.o. female  With h/o colon cancer s/p right hemicolectomy 71yrs ago,  HTN, insulin dependent DM2, CKDIII, mild dementia on Namenda who has been independent presented to [facility] ED due to n/v, ab pain since Sunday, she reports not able to keep anything down. She denies fever. Reports stool have been dark for a few months.  CT AB  Showed marked distension of gallbladder with a possible 0.3cm stone at the ampullar of vater. cbd dialated at 1.1cm, Multiple calcifications throughout pancreatis duct and in the parenchyma of the pancrease. Labs, wbc 16, hgb 10.3, bun 55, cr 3.70, tbili 4.8. She received 2liter ns and zosyn, EDP contacted [facility] who accepted patient to transfer to [place]. hospitalist to admit, [facility] to consult.  Currently patient denies pain, no active n/v. No fever.  Interim history Admtted for abdominal pain due to GB disease. GI and general surgery consulted.  Assessment & Plan   Abdominal pain secondary to related common bile duct without choledocholithiasis -?  Cholecystitis -Currently on Zosyn -MRCP: Diffuse gallbladder distention, gallbladder wall edema and mild pericholecystic fluid.  No gallstones identified.  Cannot exclude acute acalculous cholecystitis.  Increased caliber of the common bile duct without choledocholithiasis or obstructing mass. -Gastroenterology consulted and appreciated, neurosurgery consult -Patient did have elevated LFTs, mildly improving -Continue IV fluid and supportive care with pain management, antiemetics  Acute kidney injury on chronic kidney disease, stage IV -Per review of care everywhere, it seems that patient's GFR has been in stage IV level since 2018 -Baseline creatinine is approximately 1.9-2 -Creatinine on admission was 3.75, mildly improved to 3.5  this morning -Continue to monitor BMP, avoid nephrotoxic agents  Anemia, microcytic  -Reported having dark stools for several months -Hemoglobin currently 9, will continue to monitor CBC closely -Vitamin B12 level 160, folate level 18.1 -Place patient on vitamin B12 supplementation  Diabetes mellitus, type II -Continue insulin sliding scale with CBG monitoring -hemoglobin A1c 8.5  Essential Hypertension -Home meds currently held, continue hydralazine as needed  Dementia/memory impairment -Continue Namenda  History of colon cancer -Need to have peritoneal nodules on MR, suggest repeating imaging in 3 to 6 months  Hypokalemia  -will replace and continue to monitor   DVT Prophylaxis  SCDs  Code Status: Full  Family Communication: None at bedside  Disposition Plan: Admitted. Pending further recommendations from GI and general surgery  Consultants Gastroenterology General surgery  Procedures  MRCP  Antibiotics   Anti-infectives (From admission, onward)   Start     Dose/Rate Route Frequency Ordered Stop   10/17/17 2100  piperacillin-tazobactam (ZOSYN) IVPB 2.25 g     2.25 g 100 mL/hr over 30 Minutes Intravenous Every 8 hours 10/17/17 2019        Subjective:   Dalaysia Pinch seen and examined today.  Patient continues to have abdominal pain, more so on the right side. Denies current nausea or vomiting at this time. Denies chest pain, shortness of breath, dizziness, headache.   Objective:   Vitals:   10/17/17 1838 10/17/17 2150 10/18/17 0206 10/18/17 0520  BP: (!) 160/74 (!) 168/78 (!) 157/69 (!) 165/71  Pulse: 60 69 62 63  Resp: 12  18 17   Temp: 98.6 F (37 C) 98.4 F (36.9 C) 98.2 F (36.8 C) 98.3 F (36.8 C)  TempSrc:  Oral Oral Oral Oral  SpO2: 99% 96% 95% 95%  Weight:      Height:        Intake/Output Summary (Last 24 hours) at 10/18/2017 1319 Last data filed at 10/18/2017 0811 Gross per 24 hour  Intake 760.25 ml  Output 800 ml  Net -39.75 ml    Filed Weights   10/17/17 1617  Weight: 59.5 kg    Exam  General: Well developed, well nourished, NAD, appears stated age  HEENT: NCAT, mucous membranes moist.   Neck: Supple  Cardiovascular: S1 S2 auscultated, no rubs, murmurs or gallops. Regular rate and rhythm.  Respiratory: Clear to auscultation bilaterally with equal chest rise  Abdomen: Soft, RUQ TTP, nondistended, + bowel sounds  Extremities: warm dry without cyanosis clubbing or edema  Neuro: AAOx3, nonfocal  Psych: Normal affect and demeanor, pleasant    Data Reviewed: I have personally reviewed following labs and imaging studies  CBC: Recent Labs  Lab 10/17/17 1827 10/18/17 0217  WBC 15.3* 14.2*  NEUTROABS 12.9* 12.2*  HGB 9.4* 9.0*  HCT 26.6* 26.2*  MCV 73.3* 73.6*  PLT 169 242   Basic Metabolic Panel: Recent Labs  Lab 10/17/17 1827 10/18/17 0217  NA 138 139  K 3.7 3.4*  CL 107 108  CO2 19* 18*  GLUCOSE 106* 108*  BUN 49* 44*  CREATININE 3.75* 3.50*  CALCIUM 8.3* 8.4*   GFR: Estimated Creatinine Clearance: 10.4 mL/min (A) (by C-G formula based on SCr of 3.5 mg/dL (H)). Liver Function Tests: Recent Labs  Lab 10/17/17 1827 10/18/17 0217  AST 114* 102*  ALT 155* 140*  ALKPHOS 156* 150*  BILITOT 5.5* 5.3*  PROT 7.1 6.8  ALBUMIN 3.4* 3.2*   Recent Labs  Lab 10/17/17 1827  LIPASE 109*   No results for input(s): AMMONIA in the last 168 hours. Coagulation Profile: No results for input(s): INR, PROTIME in the last 168 hours. Cardiac Enzymes: No results for input(s): CKTOTAL, CKMB, CKMBINDEX, TROPONINI in the last 168 hours. BNP (last 3 results) No results for input(s): PROBNP in the last 8760 hours. HbA1C: Recent Labs    10/18/17 0217  HGBA1C 8.5*   CBG: Recent Labs  Lab 10/17/17 1829 10/17/17 2114 10/18/17 0921 10/18/17 1228  GLUCAP 105* 65* 128* 126*   Lipid Profile: No results for input(s): CHOL, HDL, LDLCALC, TRIG, CHOLHDL, LDLDIRECT in the last 72  hours. Thyroid Function Tests: Recent Labs    10/18/17 0217  TSH 0.339*   Anemia Panel: Recent Labs    10/18/17 0217  VITAMINB12 160*  FOLATE 16.1  RETICCTPCT 1.2   Urine analysis:    Component Value Date/Time   COLORURINE AMBER (A) 10/17/2017 2227   APPEARANCEUR CLEAR 10/17/2017 2227   LABSPEC 1.012 10/17/2017 2227   PHURINE 5.0 10/17/2017 2227   GLUCOSEU NEGATIVE 10/17/2017 2227   HGBUR SMALL (A) 10/17/2017 2227   BILIRUBINUR NEGATIVE 10/17/2017 [place] 10/17/2017 2227   PROTEINUR 30 (A) 10/17/2017 2227   NITRITE NEGATIVE 10/17/2017 2227   LEUKOCYTESUR NEGATIVE 10/17/2017 2227   Sepsis Labs: @LABRCNTIP (procalcitonin:4,lacticidven:4)  )No results found for this or any previous visit (from the past 240 hour(s)).    Radiology Studies: Mr Abdomen Mrcp Wo Contrast  Result Date: 10/18/2017 CLINICAL DATA:  Abnormal liver function test. History of colon cancer status post right hemicolectomy. Gallbladder distension without gallstone. EXAM: MRI ABDOMEN WITHOUT CONTRAST  (INCLUDING MRCP) TECHNIQUE: Multiplanar multisequence MR imaging of the abdomen was performed. Heavily T2-weighted images of the biliary and pancreatic  ducts were obtained, and three-dimensional MRCP images were rendered by post processing. COMPARISON:  CT 10/17/2017 and ultrasound 10/17/2017 FINDINGS: Lower chest: No acute findings. Hepatobiliary: There is no focal liver abnormality identified. The gallbladder appears markedly distended measuring approximately 7.3 by 6.2 by 7.2 cm (volume = 170 cm^3). Mild diffuse gallbladder wall thickening is identified with mild pericholecystic fluid. Increase caliber of the common bile duct measures up to 1.3 cm. Sludge within the common bile duct is identified but no choledocholithiasis. Pancreas: There is diffuse main duct dilatation with a large calcification identified within the main duct within the mid tail measuring 7 mm. Multiple calcifications are  identified throughout the main duct. The largest is in the tail of pancreas measuring up to 9 mm, image 14/12. No peripancreatic fat stranding or free fluid identified. Spleen:  Within normal limits in size and appearance. Adrenals/Urinary Tract: Normal adrenal glands. Bilateral kidney cysts are identified. The largest is in the inferior pole of right kidney measuring 1.4 cm. Stomach/Bowel: Moderate distension of the stomach. The visualized proximal and mid small bowel loops are unremarkable. The patient is status post right hemicolectomy. Enterocolonic anastomosis within the right upper quadrant of the abdomen is identified. There is soft tissue stranding and free fluid surrounding the entero colonic anastomosis. Vascular/Lymphatic: Normal appearance of the abdominal aorta. No enlarged retroperitoneal lymph nodes. Other: There is a small volume of ascites extending over the right lobe of liver. There is a tiny peritoneal nodule overlying the lateral segment of left lobe of liver, image 15/5. Small soft tissue nodule adjacent to scratch set small peritoneal nodule adjacent to the gastric antrum is noted, image 31/5. Musculoskeletal: No suspicious bone lesions identified. IMPRESSION: 1. Again seen is diffuse gallbladder distension, gallbladder wall edema and mild pericholecystic fluid. No gallstones identified. Cannot exclude acute acalculous cholecystitis. 2. Increase caliber of the common bile duct without choledocholithiasis or obstructing mass. 3. There is abnormal free fluid and soft tissue stranding in the right upper quadrant of the abdomen. In the setting of acute cholecystitis this likely reflects inflammatory changes. 4. Two tiny peritoneal nodules are identified, nonspecific. In a patient who has a history of colon cancer clinical correlation is advised within additional follow-up imaging in 3-6 months to ensure stability of these lesions to exclude recurrent tumor/peritoneal disease. Electronically Signed    By: Kerby Moors M.D.   On: 10/18/2017 09:39   Mr 3d Recon At Scanner  Result Date: 10/18/2017 CLINICAL DATA:  Abnormal liver function test. History of colon cancer status post right hemicolectomy. Gallbladder distension without gallstone. EXAM: MRI ABDOMEN WITHOUT CONTRAST  (INCLUDING MRCP) TECHNIQUE: Multiplanar multisequence MR imaging of the abdomen was performed. Heavily T2-weighted images of the biliary and pancreatic ducts were obtained, and three-dimensional MRCP images were rendered by post processing. COMPARISON:  CT 10/17/2017 and ultrasound 10/17/2017 FINDINGS: Lower chest: No acute findings. Hepatobiliary: There is no focal liver abnormality identified. The gallbladder appears markedly distended measuring approximately 7.3 by 6.2 by 7.2 cm (volume = 170 cm^3). Mild diffuse gallbladder wall thickening is identified with mild pericholecystic fluid. Increase caliber of the common bile duct measures up to 1.3 cm. Sludge within the common bile duct is identified but no choledocholithiasis. Pancreas: There is diffuse main duct dilatation with a large calcification identified within the main duct within the mid tail measuring 7 mm. Multiple calcifications are identified throughout the main duct. The largest is in the tail of pancreas measuring up to 9 mm, image 14/12. No peripancreatic fat stranding or  free fluid identified. Spleen:  Within normal limits in size and appearance. Adrenals/Urinary Tract: Normal adrenal glands. Bilateral kidney cysts are identified. The largest is in the inferior pole of right kidney measuring 1.4 cm. Stomach/Bowel: Moderate distension of the stomach. The visualized proximal and mid small bowel loops are unremarkable. The patient is status post right hemicolectomy. Enterocolonic anastomosis within the right upper quadrant of the abdomen is identified. There is soft tissue stranding and free fluid surrounding the entero colonic anastomosis. Vascular/Lymphatic: Normal  appearance of the abdominal aorta. No enlarged retroperitoneal lymph nodes. Other: There is a small volume of ascites extending over the right lobe of liver. There is a tiny peritoneal nodule overlying the lateral segment of left lobe of liver, image 15/5. Small soft tissue nodule adjacent to scratch set small peritoneal nodule adjacent to the gastric antrum is noted, image 31/5. Musculoskeletal: No suspicious bone lesions identified. IMPRESSION: 1. Again seen is diffuse gallbladder distension, gallbladder wall edema and mild pericholecystic fluid. No gallstones identified. Cannot exclude acute acalculous cholecystitis. 2. Increase caliber of the common bile duct without choledocholithiasis or obstructing mass. 3. There is abnormal free fluid and soft tissue stranding in the right upper quadrant of the abdomen. In the setting of acute cholecystitis this likely reflects inflammatory changes. 4. Two tiny peritoneal nodules are identified, nonspecific. In a patient who has a history of colon cancer clinical correlation is advised within additional follow-up imaging in 3-6 months to ensure stability of these lesions to exclude recurrent tumor/peritoneal disease. Electronically Signed   By: Kerby Moors M.D.   On: 10/18/2017 09:39     Scheduled Meds: . insulin aspart  0-15 Units Subcutaneous TID WC   Continuous Infusions: . dextrose 5 % and 0.9% NaCl 75 mL/hr at 10/18/17 1240  . piperacillin-tazobactam (ZOSYN)  IV 100 mL/hr at 10/18/17 0811     LOS: 1 day   Time Spent in minutes   45 minutes (greater than 50% of time spent with patient face to face, as well as reviewing old records, calling consults, and formulating a plan)  Cristal Ford D.O. on 10/18/2017 at 1:19 PM  Between 7am to 7pm - Please see pager noted on [email or web address]  After 7pm go to [email or web address]  And look for the night coverage person covering for me after hours  [facility] Office  [phone]

## 2017-10-18 NOTE — Consult Note (Addendum)
Reason for Consult:cholecystitis Referring Physician: Dr. Joesph July is an 84 y.o. female.  HPI: Patient is an 84 year old female who presented with nausea and vomiting right-sided pain that started 10/13 after church.  She reports nausea and vomiting after lunch, along with right-sided abdominal pain.  She does not have any memory of this before Sunday.  She initially thought she had nephritis.  She did not eat anymore on Sunday and then on Monday she tried to eat again.  Again she had nausea and vomiting and progressive pain on the right side.  She was planning to see her primary care doctor but then went to the [facility] the ED.  Abdominal ultrasound showed a distended gallbladder with mild wall thickening, pericholecystic fluid, positive Murphy sign and concern for an acalculous cholecystitis.  Ultrasound showed an 11 mm common bile duct.  A CT scan was then obtained which showed a distended gallbladder, a 1.1 cm common bile duct, possible stone in the ampulla, pancreatic duct calcifications.    Labs showed a total bilirubin of 5.6, alk phos 187, AST 189, ALT 214.  WBC was 16,000.  Creatinine was 3.7 with a BUN of 55.  She was transferred [facility]2017 and admitted by the Medicine service.  Since transfer to [facility] yesterday, she has been afebrile, vital signs are stable.  CMP shows ongoing kidney disease with a creatinine of 3.75 yesterday and 3.5 this a.m.  Alk phos 156 yesterday and 150 this a.m. AST 114 yesterday, 102 today.  ALT 155/140.  Total bilirubin 5.5 yesterday 5.3 today.  WBC was 15.3 yesterday and 14.2 this a.m.  Lipase last PM was 109.  MRCP was completed last evening.  This shows diffuse gallbladder distention gallbladder wall edema and mild pericholecystic fluid.  No gallstones were identified is increased caliber of the common bile duct without choledocholithiasis or obstructing mass. There is diffuse ductal dilatation of the pancreas with  a large calcification identified within the main duct and within the mid tail measuring 7 mm.  Multiple calcifications are identified throughout the main duct.  The largest is in the tail measuring up to 9 mm.  The patient's past medical history includes a history of T2N0 colon cancer with  transverse colectomy, 04/2001, in [place].  Repeat exploratory laparotomy, lysis of adhesions small bowel resection, re-anastomosis of the ileum to the colon for postop small bowel obstruction.  Type 2 diabetes, history of DKA 09/2016, diabetic neuropathy, microcytic anemia, chronic kidney disease stage IV, a history of possible anxiety and dementia -on Namenda ( she notes memory loss, but is still driving some, her daughter does her bills.  She is also undergone abdominal hysterectomy in the past;  we are asked to see.    Past Medical History:  Diagnosis Date  . Arthritis   . Cancer [facility]) 2003    colon  . Diabetes mellitus without complication ([place]) - since she was a young adult Diabetic neuropathy   . Hypertension   . Wears dentures    full top,lower partial  . Wears glasses     Past Surgical History:  Procedure Laterality Date  . ABDOMINAL HYSTERECTOMY    . COLONOSCOPY     x3  . NAILBED REPAIR Left 08/15/2012   Procedure: LEFT THUMB NAIL BED/FOLD BIOPSY;  Surgeon: Tennis Must, MD;  Location: [place];  Service: Orthopedics;  Laterality: Left;  . PARTIAL COLECTOMY  2003   small bowel and  part colon resection-cancer    History reviewed. No pertinent family history.  Social History:  reports that she has never smoked. She has never used smokeless tobacco. She reports that she does not drink alcohol or use drugs. She lives alone, she has someone who comes in to help with cleaning and chores around the house.  She also drives patient to different places when she is there.  Patient still drives to very local places like church.  She is aware of some memory loss. Tobacco:  None Drugs: None EtOH: None She is a retired Marine scientist.  Allergies: No Known Allergies  Prior to Admission medications   Medication Sig Start Date End Date Taking? Authorizing Provider  acetaminophen (TYLENOL) 325 MG tablet Take 650 mg by mouth every 6 (six) hours as needed for mild pain.   Yes [provider]  amLODipine (NORVASC) 5 MG tablet Take 5 mg by mouth daily.  07/05/11  Yes [provider]  aspirin EC 81 MG tablet Take 81 mg by mouth daily.   Yes [provider]  BYSTOLIC 20 MG TABS 20 mg every morning.  05/15/11  Yes [provider]  Cholecalciferol (VITAMIN D3) 5000 units TABS Take 5,000 Units by mouth once a week. mondays   Yes [provider]  clonazePAM (KLONOPIN) 0.5 MG disintegrating tablet Take 0.5 mg by mouth 2 (two) times daily as needed.    Yes [provider]  gabapentin (NEURONTIN) 100 MG capsule Take 100 mg by mouth 3 (three) times daily. 10/09/17  Yes [provider]  hydrochlorothiazide (HYDRODIURIL) 25 MG tablet Take 25 mg by mouth daily.    Yes [provider]  LEVEMIR FLEXPEN 100 UNIT/ML injection 30 Units every evening.  07/04/11  Yes [provider]  memantine (NAMENDA) 5 MG tablet Take 5 mg by mouth 2 (two) times daily. 08/07/17  Yes [provider]  mirtazapine (REMERON) 15 MG tablet Take 15 mg by mouth at bedtime. 09/01/17  Yes [provider]  ramipril (ALTACE) 10 MG capsule Take 10 mg by mouth daily.  06/09/11  Yes [provider]  sertraline (ZOLOFT) 50 MG tablet Take 50 mg by mouth at bedtime.  05/03/11  Yes [provider]  TRESIBA FLEXTOUCH 100 UNIT/ML SOPN FlexTouch Pen Inject 50 Units into the skin at bedtime. 09/25/17  Yes [provider]  HYDROcodone-acetaminophen (NORCO) 5-325 MG per tablet 1-2 tabs po q6 hours prn pain Patient not taking: Reported on 10/17/2017 08/15/12   Leanora Cover, MD     Results for orders placed or performed during  the hospital encounter of 10/17/17 (from the past 48 hour(s))  CBC with Differential/Platelet     Status: Abnormal   Collection Time: 10/17/17  6:27 PM  Result Value Ref Range   WBC 15.3 (H) 4.0 - 10.5 K/uL   RBC 3.63 (L) 3.87 - 5.11 MIL/uL   Hemoglobin 9.4 (L) 12.0 - 15.0 g/dL   HCT 26.6 (L) 36.0 - 46.0 %   MCV 73.3 (L) 80.0 - 100.0 fL   MCH 25.9 (L) 26.0 - 34.0 pg   MCHC 35.3 30.0 - 36.0 g/dL   RDW 14.1 11.5 - 15.5 %   Platelets 169 150 - 400 K/uL   nRBC 0.0 0.0 - 0.2 %   Neutrophils Relative % 84 %   Neutro Abs 12.9 (H) 1.7 - 7.7 K/uL   Lymphocytes Relative 7 %   Lymphs Abs 1.1 0.7 - 4.0 K/uL   Monocytes Relative 8 %  Monocytes Absolute 1.1 (H) 0.1 - 1.0 K/uL   Eosinophils Relative 0 %   Eosinophils Absolute 0.0 0.0 - 0.5 K/uL   Basophils Relative 0 %   Basophils Absolute 0.0 0.0 - 0.1 K/uL   Immature Granulocytes 1 %   Abs Immature Granulocytes 0.10 (H) 0.00 - 0.07 K/uL    Comment: Performed at [facility] [address], [place], [place] [address]  Comprehensive metabolic panel     Status: Abnormal   Collection Time: 10/17/17  6:27 PM  Result Value Ref Range   Sodium 138 135 - 145 mmol/L   Potassium 3.7 3.5 - 5.1 mmol/L   Chloride 107 98 - 111 mmol/L   CO2 19 (L) 22 - 32 mmol/L   Glucose, Bld 106 (H) 70 - 99 mg/dL   BUN 49 (H) 8 - 23 mg/dL   Creatinine, Ser 3.75 (H) 0.44 - 1.00 mg/dL   Calcium 8.3 (L) 8.9 - 10.3 mg/dL   Total Protein 7.1 6.5 - 8.1 g/dL   Albumin 3.4 (L) 3.5 - 5.0 g/dL   AST 114 (H) 15 - 41 U/L   ALT 155 (H) 0 - 44 U/L   Alkaline Phosphatase 156 (H) 38 - 126 U/L   Total Bilirubin 5.5 (H) 0.3 - 1.2 mg/dL   GFR calc non Af Amer 10 (L) >60 mL/min   GFR calc Af Amer 11 (L) >60 mL/min    Comment: (NOTE) The eGFR has been calculated using the CKD EPI equation. This calculation has not been validated in all clinical situations. eGFR's persistently <60 mL/min signify possible Chronic Kidney Disease.    Anion gap 12 5 - 15    Comment:  Performed at [facility] [address], [place], [place] [address]  Lipase, blood     Status: Abnormal   Collection Time: 10/17/17  6:27 PM  Result Value Ref Range   Lipase 109 (H) 11 - 51 U/L    Comment: Performed at [facility], [place] [address], [place], [place] [address]  Glucose, capillary     Status: Abnormal   Collection Time: 10/17/17  6:29 PM  Result Value Ref Range   Glucose-Capillary 105 (H) 70 - 99 mg/dL  Glucose, capillary     Status: Abnormal   Collection Time: 10/17/17  9:14 PM  Result Value Ref Range   Glucose-Capillary 65 (L) 70 - 99 mg/dL  Urinalysis, Routine w reflex microscopic     Status: Abnormal   Collection Time: 10/17/17 10:27 PM  Result Value Ref Range   Color, Urine AMBER (A) YELLOW    Comment: BIOCHEMICALS MAY BE AFFECTED BY COLOR   APPearance CLEAR CLEAR   Specific Gravity, Urine 1.012 1.005 - 1.030   pH 5.0 5.0 - 8.0   Glucose, UA NEGATIVE NEGATIVE mg/dL   Hgb urine dipstick SMALL (A) NEGATIVE   Bilirubin Urine NEGATIVE NEGATIVE   Ketones, ur NEGATIVE NEGATIVE mg/dL   Protein, ur 30 (A) NEGATIVE mg/dL   Nitrite NEGATIVE NEGATIVE   Leukocytes, UA NEGATIVE NEGATIVE   RBC / HPF 0-5 0 - 5 RBC/hpf   WBC, UA 6-10 0 - 5 WBC/hpf   Bacteria, UA NONE SEEN NONE SEEN   Squamous Epithelial / LPF 0-5 0 - 5    Comment: Performed at [facility], [place] [address], [place],  [address]  CBC with Differential/Platelet     Status: Abnormal   Collection Time: 10/18/17  2:17 AM  Result Value Ref Range  WBC 14.2 (H) 4.0 - 10.5 K/uL    Comment: REPEATED TO VERIFY WHITE COUNT CONFIRMED ON SMEAR    RBC 3.56 (L) 3.87 - 5.11 MIL/uL   Hemoglobin 9.0 (L) 12.0 - 15.0 g/dL   HCT 26.2 (L) 36.0 - 46.0 %   MCV 73.6 (L) 80.0 - 100.0 fL   MCH 25.3 (L) 26.0 - 34.0 pg   MCHC 34.4 30.0 - 36.0 g/dL   RDW 14.0 11.5 - 15.5 %   Platelets 168 150 - 400 K/uL   nRBC 0.0 0.0 - 0.2 %   Neutrophils Relative % 86 %   Neutro Abs 12.2 (H) 1.7 - 7.7 K/uL    Lymphocytes Relative 6 %   Lymphs Abs 0.9 0.7 - 4.0 K/uL   Monocytes Relative 7 %   Monocytes Absolute 1.1 (H) 0.1 - 1.0 K/uL   Eosinophils Relative 0 %   Eosinophils Absolute 0.0 0.0 - 0.5 K/uL   Basophils Relative 0 %   Basophils Absolute 0.0 0.0 - 0.1 K/uL   WBC Morphology TOXIC GRANULATION     Comment: VACUOLATED NEUTROPHILS   Immature Granulocytes 1 %   Abs Immature Granulocytes 0.09 (H) 0.00 - 0.07 K/uL   Burr Cells PRESENT    Target Cells PRESENT     Comment: Performed at [facility], [address] [place], [place] [address]  Comprehensive metabolic panel     Status: Abnormal   Collection Time: 10/18/17  2:17 AM  Result Value Ref Range   Sodium 139 135 - 145 mmol/L   Potassium 3.4 (L) 3.5 - 5.1 mmol/L   Chloride 108 98 - 111 mmol/L   CO2 18 (L) 22 - 32 mmol/L   Glucose, Bld 108 (H) 70 - 99 mg/dL   BUN 44 (H) 8 - 23 mg/dL   Creatinine, Ser 3.50 (H) 0.44 - 1.00 mg/dL   Calcium 8.4 (L) 8.9 - 10.3 mg/dL   Total Protein 6.8 6.5 - 8.1 g/dL   Albumin 3.2 (L) 3.5 - 5.0 g/dL   AST 102 (H) 15 - 41 U/L   ALT 140 (H) 0 - 44 U/L   Alkaline Phosphatase 150 (H) 38 - 126 U/L   Total Bilirubin 5.3 (H) 0.3 - 1.2 mg/dL   GFR calc non Af Amer 11 (L) >60 mL/min   GFR calc Af Amer 12 (L) >60 mL/min    Comment: (NOTE) The eGFR has been calculated using the CKD EPI equation. This calculation has not been validated in all clinical situations. eGFR's persistently <60 mL/min signify possible Chronic Kidney Disease.    Anion gap 13 5 - 15    Comment: Performed at [facility] [address], [place], [place] [address]  Hemoglobin A1c     Status: Abnormal   Collection Time: 10/18/17  2:17 AM  Result Value Ref Range   Hgb A1c MFr Bld 8.5 (H) 4.8 - 5.6 %    Comment: (NOTE) Pre diabetes:          5.7%-6.4% Diabetes:              >6.4% Glycemic control for   <7.0% adults with diabetes    Mean Plasma Glucose 197.25 mg/dL    Comment: Performed at [facility] [address] [place], [place] [address]  Vitamin B12     Status: Abnormal   Collection Time: 10/18/17  2:17 AM  Result Value Ref Range   Vitamin B-12 160 (L) 180 - 914 pg/mL  Comment: (NOTE) This assay is not validated for testing neonatal or myeloproliferative syndrome specimens for Vitamin B12 levels. Performed at [facility], [place] [address], [place], [place] [address]   Folate     Status: None   Collection Time: 10/18/17  2:17 AM  Result Value Ref Range   Folate 16.1 >5.9 ng/mL    Comment: Performed at [facility] [address] [place], [place] [address]  TSH     Status: Abnormal   Collection Time: 10/18/17  2:17 AM  Result Value Ref Range   TSH 0.339 (L) 0.350 - 4.500 uIU/mL    Comment: Performed by a 3rd Generation assay with a functional sensitivity of <=0.01 uIU/mL. Performed at [facility], [place] [address], [place], [place] [address]   Reticulocytes     Status: Abnormal   Collection Time: 10/18/17  2:17 AM  Result Value Ref Range   Retic Ct Pct 1.2 0.4 - 3.1 %   RBC. 3.56 (L) 3.87 - 5.11 MIL/uL   Retic Count, Absolute 43.8 19.0 - 186.0 K/uL   Immature Retic Fract 12.9 2.3 - 15.9 %    Comment: Performed at [facility] [address], [place],  [address]  Bilirubin, direct     Status: Abnormal   Collection Time: 10/18/17  2:17 AM  Result Value Ref Range   Bilirubin, Direct 3.8 (H) 0.0 - 0.2 mg/dL    Comment: Performed at [facility] [address], [place], [place] [address]  Glucose, capillary     Status: Abnormal   Collection Time: 10/18/17  9:21 AM  Result Value Ref Range   Glucose-Capillary 128 (H) 70 - 99 mg/dL    Mr Abdomen Mrcp Wo Contrast  Result Date: 10/18/2017 CLINICAL DATA:  Abnormal liver function test. History of colon cancer status post right hemicolectomy. Gallbladder distension without gallstone. EXAM: MRI ABDOMEN WITHOUT CONTRAST  (INCLUDING MRCP) TECHNIQUE: Multiplanar multisequence MR imaging of the  abdomen was performed. Heavily T2-weighted images of the biliary and pancreatic ducts were obtained, and three-dimensional MRCP images were rendered by post processing. COMPARISON:  CT 10/17/2017 and ultrasound 10/17/2017 FINDINGS: Lower chest: No acute findings. Hepatobiliary: There is no focal liver abnormality identified. The gallbladder appears markedly distended measuring approximately 7.3 by 6.2 by 7.2 cm (volume = 170 cm^3). Mild diffuse gallbladder wall thickening is identified with mild pericholecystic fluid. Increase caliber of the common bile duct measures up to 1.3 cm. Sludge within the common bile duct is identified but no choledocholithiasis. Pancreas: There is diffuse main duct dilatation with a large calcification identified within the main duct within the mid tail measuring 7 mm. Multiple calcifications are identified throughout the main duct. The largest is in the tail of pancreas measuring up to 9 mm, image 14/12. No peripancreatic fat stranding or free fluid identified. Spleen:  Within normal limits in size and appearance. Adrenals/Urinary Tract: Normal adrenal glands. Bilateral kidney cysts are identified. The largest is in the inferior pole of right kidney measuring 1.4 cm. Stomach/Bowel: Moderate distension of the stomach. The visualized proximal and mid small bowel loops are unremarkable. The patient is status post right hemicolectomy. Enterocolonic anastomosis within the right upper quadrant of the abdomen is identified. There is soft tissue stranding and free fluid surrounding the entero colonic anastomosis. Vascular/Lymphatic: Normal appearance of the abdominal aorta. No enlarged retroperitoneal lymph nodes. Other: There is a small volume of ascites extending over the right lobe of liver. There is a  tiny peritoneal nodule overlying the lateral segment of left lobe of liver, image 15/5. Small soft tissue nodule adjacent to scratch set small peritoneal nodule adjacent to the gastric antrum  is noted, image 31/5. Musculoskeletal: No suspicious bone lesions identified. IMPRESSION: 1. Again seen is diffuse gallbladder distension, gallbladder wall edema and mild pericholecystic fluid. No gallstones identified. Cannot exclude acute acalculous cholecystitis. 2. Increase caliber of the common bile duct without choledocholithiasis or obstructing mass. 3. There is abnormal free fluid and soft tissue stranding in the right upper quadrant of the abdomen. In the setting of acute cholecystitis this likely reflects inflammatory changes. 4. Two tiny peritoneal nodules are identified, nonspecific. In a patient who has a history of colon cancer clinical correlation is advised within additional follow-up imaging in 3-6 months to ensure stability of these lesions to exclude recurrent tumor/peritoneal disease. Electronically Signed   By: Kerby Moors M.D.   On: 10/18/2017 09:39   Mr 3d Recon At Scanner  Result Date: 10/18/2017 CLINICAL DATA:  Abnormal liver function test. History of colon cancer status post right hemicolectomy. Gallbladder distension without gallstone. EXAM: MRI ABDOMEN WITHOUT CONTRAST  (INCLUDING MRCP) TECHNIQUE: Multiplanar multisequence MR imaging of the abdomen was performed. Heavily T2-weighted images of the biliary and pancreatic ducts were obtained, and three-dimensional MRCP images were rendered by post processing. COMPARISON:  CT 10/17/2017 and ultrasound 10/17/2017 FINDINGS: Lower chest: No acute findings. Hepatobiliary: There is no focal liver abnormality identified. The gallbladder appears markedly distended measuring approximately 7.3 by 6.2 by 7.2 cm (volume = 170 cm^3). Mild diffuse gallbladder wall thickening is identified with mild pericholecystic fluid. Increase caliber of the common bile duct measures up to 1.3 cm. Sludge within the common bile duct is identified but no choledocholithiasis. Pancreas: There is diffuse main duct dilatation with a large calcification identified  within the main duct within the mid tail measuring 7 mm. Multiple calcifications are identified throughout the main duct. The largest is in the tail of pancreas measuring up to 9 mm, image 14/12. No peripancreatic fat stranding or free fluid identified. Spleen:  Within normal limits in size and appearance. Adrenals/Urinary Tract: Normal adrenal glands. Bilateral kidney cysts are identified. The largest is in the inferior pole of right kidney measuring 1.4 cm. Stomach/Bowel: Moderate distension of the stomach. The visualized proximal and mid small bowel loops are unremarkable. The patient is status post right hemicolectomy. Enterocolonic anastomosis within the right upper quadrant of the abdomen is identified. There is soft tissue stranding and free fluid surrounding the entero colonic anastomosis. Vascular/Lymphatic: Normal appearance of the abdominal aorta. No enlarged retroperitoneal lymph nodes. Other: There is a small volume of ascites extending over the right lobe of liver. There is a tiny peritoneal nodule overlying the lateral segment of left lobe of liver, image 15/5. Small soft tissue nodule adjacent to scratch set small peritoneal nodule adjacent to the gastric antrum is noted, image 31/5. Musculoskeletal: No suspicious bone lesions identified. IMPRESSION: 1. Again seen is diffuse gallbladder distension, gallbladder wall edema and mild pericholecystic fluid. No gallstones identified. Cannot exclude acute acalculous cholecystitis. 2. Increase caliber of the common bile duct without choledocholithiasis or obstructing mass. 3. There is abnormal free fluid and soft tissue stranding in the right upper quadrant of the abdomen. In the setting of acute cholecystitis this likely reflects inflammatory changes. 4. Two tiny peritoneal nodules are identified, nonspecific. In a patient who has a history of colon cancer clinical correlation is advised within additional follow-up imaging in 3-6 months to  ensure stability  of these lesions to exclude recurrent tumor/peritoneal disease. Electronically Signed   By: Kerby Moors M.D.   On: 10/18/2017 09:39    Review of Systems  Constitutional: Negative.   HENT: Negative.   Eyes: Negative.   Respiratory: Negative.   Cardiovascular: Negative.   Gastrointestinal: Positive for abdominal pain (on the right), nausea and vomiting. Negative for blood in stool, constipation, diarrhea, heartburn and melena.  Genitourinary: Negative.   Musculoskeletal: Positive for back pain (on the right since onset of sx).  Skin: Negative.   Neurological: Negative.   Endo/Heme/Allergies: Negative.   Psychiatric/Behavioral: Positive for depression and memory loss.   Blood pressure (!) 165/71, pulse 63, temperature 98.3 F (36.8 C), temperature source Oral, resp. rate 17, height '5\' 6"'$  (1.676 m), weight 59.5 kg, SpO2 95 %. Physical Exam  Constitutional: She is oriented to person, place, and time. She appears well-developed and well-nourished. No distress.  HENT:  Head: Normocephalic and atraumatic.  Mouth/Throat: Oropharynx is clear and moist.  Eyes: Right eye exhibits no discharge. Left eye exhibits no discharge.  Pupils are equal  Neck: Normal range of motion. Neck supple. No JVD present. No tracheal deviation present. No thyromegaly present.  Cardiovascular: Normal rate, regular rhythm, normal heart sounds and intact distal pulses.  No murmur heard. Respiratory: Effort normal and breath sounds normal. No respiratory distress. She has no wheezes. She has no rales. She exhibits no tenderness.  GI: Soft. Bowel sounds are normal. She exhibits no distension and no mass. There is tenderness (She is tender on the right side.  Right upper quadrant is more tender than the right lower quadrant.). There is no rebound and no guarding.  She has a well-healed midline abdominal scar from below her xiphoid to below her umbilicus.  Musculoskeletal: She exhibits no edema or tenderness.   Lymphadenopathy:    She has no cervical adenopathy.  Neurological: She is alert and oriented to person, place, and time. No cranial nerve deficit.  Skin: Skin is warm and dry. No rash noted. She is not diaphoretic. No erythema. No pallor.  Psychiatric: She has a normal mood and affect. Her behavior is normal. Judgment and thought content normal.  She knows her memory is off.  She continues to drive short distances.  Sunday she drove to church and back.  She says she does not try to go where she might get lost.  She reports her daughter put her on Namenda for her memory, and her daughter pays her bills.    Assessment/Plan: Acalculus cholecystitis with common bile duct dilatation Pancreatic ductal calcification, possible mild pancreatitis Elevated LFTs Acute kidney injury -the last labs we have are from 2013 creatinine was 0.97 History of colon cancer T2N0 -with transverse colectomy 04/2001, S/P exploratory laparotomy, LOA, small bowel resection, re-anastomosis ileum to colon Type 2 diabetes Diabetic neuropathy Acute on chronic kidney disease stage IV Vitamin D deficiency Anemia Hyperlipidemia Memory loss  Plan: Gastroenterology is following and Dr. Rush Landmark is going to review the case.  Patient is currently without nausea or vomiting.  She remains tender on the right side the right upper quadrant more than the right lower quadrant.  Nausea and vomiting have resolved but she has not had anything to eat, pain persist on the right side.  Dr. Rosendo Gros will also reviewed and we will discuss options with the patient, and follow with you.    Marjorie Deprey 10/18/2017, 12:28 PM

## 2017-10-19 ENCOUNTER — Encounter: Payer: Self-pay | Admitting: Gastroenterology

## 2017-10-19 ENCOUNTER — Inpatient Hospital Stay (HOSPITAL_COMMUNITY): Payer: Medicare Other

## 2017-10-19 DIAGNOSIS — D638 Anemia in other chronic diseases classified elsewhere: Secondary | ICD-10-CM

## 2017-10-19 DIAGNOSIS — R1011 Right upper quadrant pain: Secondary | ICD-10-CM

## 2017-10-19 DIAGNOSIS — R945 Abnormal results of liver function studies: Secondary | ICD-10-CM

## 2017-10-19 DIAGNOSIS — R7989 Other specified abnormal findings of blood chemistry: Secondary | ICD-10-CM

## 2017-10-19 LAB — COMPREHENSIVE METABOLIC PANEL
ALT: 99 U/L — AB (ref 0–44)
ANION GAP: 9 (ref 5–15)
AST: 56 U/L — ABNORMAL HIGH (ref 15–41)
Albumin: 2.7 g/dL — ABNORMAL LOW (ref 3.5–5.0)
Alkaline Phosphatase: 123 U/L (ref 38–126)
BUN: 44 mg/dL — ABNORMAL HIGH (ref 8–23)
CHLORIDE: 110 mmol/L (ref 98–111)
CO2: 16 mmol/L — AB (ref 22–32)
CREATININE: 3.52 mg/dL — AB (ref 0.44–1.00)
Calcium: 8.2 mg/dL — ABNORMAL LOW (ref 8.9–10.3)
GFR, EST AFRICAN AMERICAN: 12 mL/min — AB (ref >60)
GFR, EST NON AFRICAN AMERICAN: 11 mL/min — AB (ref >60)
Glucose, Bld: 183 mg/dL — ABNORMAL HIGH (ref 70–99)
Potassium: 4 mmol/L (ref 3.5–5.1)
SODIUM: 135 mmol/L (ref 135–145)
Total Bilirubin: 2.2 mg/dL — ABNORMAL HIGH (ref 0.3–1.2)
Total Protein: 6 g/dL — ABNORMAL LOW (ref 6.5–8.1)

## 2017-10-19 LAB — CBC
HEMATOCRIT: 22.1 % — AB (ref 36.0–46.0)
HEMOGLOBIN: 7.7 g/dL — AB (ref 12.0–15.0)
MCH: 25.8 pg — ABNORMAL LOW (ref 26.0–34.0)
MCHC: 34.8 g/dL (ref 30.0–36.0)
MCV: 74.2 fL — ABNORMAL LOW (ref 80.0–100.0)
NRBC: 0 % (ref 0.0–0.2)
PLATELETS: 147 10*3/uL — AB (ref 150–400)
RBC: 2.98 MIL/uL — ABNORMAL LOW (ref 3.87–5.11)
RDW: 13.9 % (ref 11.5–15.5)
WBC: 10.6 10*3/uL — AB (ref 4.0–10.5)

## 2017-10-19 LAB — GLUCOSE, CAPILLARY
GLUCOSE-CAPILLARY: 254 mg/dL — AB (ref 70–99)
Glucose-Capillary: 134 mg/dL — ABNORMAL HIGH (ref 70–99)
Glucose-Capillary: 146 mg/dL — ABNORMAL HIGH (ref 70–99)
Glucose-Capillary: 54 mg/dL — ABNORMAL LOW (ref 70–99)
Glucose-Capillary: 103 mg/dL — ABNORMAL HIGH (ref 70–99)

## 2017-10-19 LAB — HEMOGLOBIN AND HEMATOCRIT, BLOOD
HEMATOCRIT: 22.2 % — AB (ref 36.0–46.0)
Hemoglobin: 7.6 g/dL — ABNORMAL LOW (ref 12.0–15.0)

## 2017-10-19 MED ORDER — GLUCOSE 4 G PO CHEW
CHEWABLE_TABLET | ORAL | Status: AC
  Administered 2017-10-19: 4 g
  Filled 2017-10-19: qty 1

## 2017-10-19 MED ORDER — CYANOCOBALAMIN 1000 MCG/ML IJ SOLN
1000.0000 ug | Freq: Once | INTRAMUSCULAR | Status: AC
  Administered 2017-10-19: 1000 ug via INTRAMUSCULAR
  Filled 2017-10-19: qty 1

## 2017-10-19 NOTE — Progress Notes (Signed)
Patient f/u [facility] 103 patient was asymptomatic. Arthor Captain LPN

## 2017-10-19 NOTE — Progress Notes (Signed)
Patient ID: Danielle Bright, female   DOB: [birth year], 84 y.o.   MRN: [id number]       Subjective: Still with some RUQ discomfort, but not significant.    Objective: Vital signs in last 24 hours: Temp:  [98 F (36.7 C)-98.3 F (36.8 C)] 98.3 F (36.8 C) (10/18 0604) Pulse Rate:  [58-64] 58 (10/18 0604) Resp:  [16-17] 16 (10/18 0604) BP: (128-136)/(66-68) 136/66 (10/18 0604) SpO2:  [99 %-100 %] 99 % (10/18 0604) Last BM Date: 10/18/17  Intake/Output from previous day: 10/17 0701 - 10/18 0700 In: 1364.9 [P.O.:120; I.V.:1091.4; IV Piggyback:153.5] Out: 300 [Urine:300] Intake/Output this shift: No intake/output data recorded.  PE: Heart: regular Lungs: CTAB Abd: soft, mild RUQ tenderness, but no guarding or peritoneal signs, otherwise NT, ND, +BS  Lab Results:  Recent Labs    10/18/17 0217 10/19/17 0248 10/19/17 0813  WBC 14.2* 10.6*  --   HGB 9.0* 7.7* 7.6*  HCT 26.2* 22.1* 22.2*  PLT 168 147*  --    BMET Recent Labs    10/18/17 0217 10/19/17 0248  NA 139 135  K 3.4* 4.0  CL 108 110  CO2 18* 16*  GLUCOSE 108* 183*  BUN 44* 44*  CREATININE 3.50* 3.52*  CALCIUM 8.4* 8.2*   PT/INR No results for input(s): LABPROT, INR in the last 72 hours. CMP     Component Value Date/Time   NA 135 10/19/2017 0248   K 4.0 10/19/2017 0248   CL 110 10/19/2017 0248   CO2 16 (L) 10/19/2017 0248   GLUCOSE 183 (H) 10/19/2017 0248   BUN 44 (H) 10/19/2017 0248   CREATININE 3.52 (H) 10/19/2017 0248   CALCIUM 8.2 (L) 10/19/2017 0248   PROT 6.0 (L) 10/19/2017 0248   ALBUMIN 2.7 (L) 10/19/2017 0248   AST 56 (H) 10/19/2017 0248   ALT 99 (H) 10/19/2017 0248   ALKPHOS 123 10/19/2017 0248   BILITOT 2.2 (H) 10/19/2017 0248   GFRNONAA 11 (L) 10/19/2017 0248   GFRAA 12 (L) 10/19/2017 0248   Lipase     Component Value Date/Time   LIPASE 109 (H) 10/17/2017 1827       Studies/Results: Nm Hepatobiliary Liver Func  Result Date: 10/18/2017 CLINICAL DATA:  Right upper quadrant  pain and vomiting for several days. EXAM: NUCLEAR MEDICINE HEPATOBILIARY IMAGING TECHNIQUE: Sequential images of the abdomen were obtained out to 60 minutes following intravenous administration of radiopharmaceutical. Because no gallbladder activity was seen, 2.4 mg morphine was administered intravenously, and imaging was continued for another 30 minutes. RADIOPHARMACEUTICALS:  8.1 mCi Tc-20m  Choletec IV COMPARISON:  None. FINDINGS: Prompt uptake and biliary excretion of activity by the liver is seen. Gallbladder activity is visualized following morphine administration, consistent with patency of cystic duct. Biliary activity passes into small bowel, consistent with patent common bile duct. IMPRESSION: Patency of cystic duct and common bile duct are demonstrated. Electronically Signed   By: Earle Gell M.D.   On: 10/18/2017 19:16   Mr Abdomen Mrcp Wo Contrast  Result Date: 10/18/2017 CLINICAL DATA:  Abnormal liver function test. History of colon cancer status post right hemicolectomy. Gallbladder distension without gallstone. EXAM: MRI ABDOMEN WITHOUT CONTRAST  (INCLUDING MRCP) TECHNIQUE: Multiplanar multisequence MR imaging of the abdomen was performed. Heavily T2-weighted images of the biliary and pancreatic ducts were obtained, and three-dimensional MRCP images were rendered by post processing. COMPARISON:  CT 10/17/2017 and ultrasound 10/17/2017 FINDINGS: Lower chest: No acute findings. Hepatobiliary: There is no focal liver abnormality identified. The gallbladder appears  markedly distended measuring approximately 7.3 by 6.2 by 7.2 cm (volume = 170 cm^3). Mild diffuse gallbladder wall thickening is identified with mild pericholecystic fluid. Increase caliber of the common bile duct measures up to 1.3 cm. Sludge within the common bile duct is identified but no choledocholithiasis. Pancreas: There is diffuse main duct dilatation with a large calcification identified within the main duct within the mid tail  measuring 7 mm. Multiple calcifications are identified throughout the main duct. The largest is in the tail of pancreas measuring up to 9 mm, image 14/12. No peripancreatic fat stranding or free fluid identified. Spleen:  Within normal limits in size and appearance. Adrenals/Urinary Tract: Normal adrenal glands. Bilateral kidney cysts are identified. The largest is in the inferior pole of right kidney measuring 1.4 cm. Stomach/Bowel: Moderate distension of the stomach. The visualized proximal and mid small bowel loops are unremarkable. The patient is status post right hemicolectomy. Enterocolonic anastomosis within the right upper quadrant of the abdomen is identified. There is soft tissue stranding and free fluid surrounding the entero colonic anastomosis. Vascular/Lymphatic: Normal appearance of the abdominal aorta. No enlarged retroperitoneal lymph nodes. Other: There is a small volume of ascites extending over the right lobe of liver. There is a tiny peritoneal nodule overlying the lateral segment of left lobe of liver, image 15/5. Small soft tissue nodule adjacent to scratch set small peritoneal nodule adjacent to the gastric antrum is noted, image 31/5. Musculoskeletal: No suspicious bone lesions identified. IMPRESSION: 1. Again seen is diffuse gallbladder distension, gallbladder wall edema and mild pericholecystic fluid. No gallstones identified. Cannot exclude acute acalculous cholecystitis. 2. Increase caliber of the common bile duct without choledocholithiasis or obstructing mass. 3. There is abnormal free fluid and soft tissue stranding in the right upper quadrant of the abdomen. In the setting of acute cholecystitis this likely reflects inflammatory changes. 4. Two tiny peritoneal nodules are identified, nonspecific. In a patient who has a history of colon cancer clinical correlation is advised within additional follow-up imaging in 3-6 months to ensure stability of these lesions to exclude recurrent  tumor/peritoneal disease. Electronically Signed   By: Kerby Moors M.D.   On: 10/18/2017 09:39   Mr 3d Recon At Scanner  Result Date: 10/18/2017 CLINICAL DATA:  Abnormal liver function test. History of colon cancer status post right hemicolectomy. Gallbladder distension without gallstone. EXAM: MRI ABDOMEN WITHOUT CONTRAST  (INCLUDING MRCP) TECHNIQUE: Multiplanar multisequence MR imaging of the abdomen was performed. Heavily T2-weighted images of the biliary and pancreatic ducts were obtained, and three-dimensional MRCP images were rendered by post processing. COMPARISON:  CT 10/17/2017 and ultrasound 10/17/2017 FINDINGS: Lower chest: No acute findings. Hepatobiliary: There is no focal liver abnormality identified. The gallbladder appears markedly distended measuring approximately 7.3 by 6.2 by 7.2 cm (volume = 170 cm^3). Mild diffuse gallbladder wall thickening is identified with mild pericholecystic fluid. Increase caliber of the common bile duct measures up to 1.3 cm. Sludge within the common bile duct is identified but no choledocholithiasis. Pancreas: There is diffuse main duct dilatation with a large calcification identified within the main duct within the mid tail measuring 7 mm. Multiple calcifications are identified throughout the main duct. The largest is in the tail of pancreas measuring up to 9 mm, image 14/12. No peripancreatic fat stranding or free fluid identified. Spleen:  Within normal limits in size and appearance. Adrenals/Urinary Tract: Normal adrenal glands. Bilateral kidney cysts are identified. The largest is in the inferior pole of right kidney measuring 1.4 cm. Stomach/Bowel:  Moderate distension of the stomach. The visualized proximal and mid small bowel loops are unremarkable. The patient is status post right hemicolectomy. Enterocolonic anastomosis within the right upper quadrant of the abdomen is identified. There is soft tissue stranding and free fluid surrounding the entero  colonic anastomosis. Vascular/Lymphatic: Normal appearance of the abdominal aorta. No enlarged retroperitoneal lymph nodes. Other: There is a small volume of ascites extending over the right lobe of liver. There is a tiny peritoneal nodule overlying the lateral segment of left lobe of liver, image 15/5. Small soft tissue nodule adjacent to scratch set small peritoneal nodule adjacent to the gastric antrum is noted, image 31/5. Musculoskeletal: No suspicious bone lesions identified. IMPRESSION: 1. Again seen is diffuse gallbladder distension, gallbladder wall edema and mild pericholecystic fluid. No gallstones identified. Cannot exclude acute acalculous cholecystitis. 2. Increase caliber of the common bile duct without choledocholithiasis or obstructing mass. 3. There is abnormal free fluid and soft tissue stranding in the right upper quadrant of the abdomen. In the setting of acute cholecystitis this likely reflects inflammatory changes. 4. Two tiny peritoneal nodules are identified, nonspecific. In a patient who has a history of colon cancer clinical correlation is advised within additional follow-up imaging in 3-6 months to ensure stability of these lesions to exclude recurrent tumor/peritoneal disease. Electronically Signed   By: Kerby Moors M.D.   On: 10/18/2017 09:39    Anti-infectives: Anti-infectives (From admission, onward)   Start     Dose/Rate Route Frequency Ordered Stop   10/17/17 2100  piperacillin-tazobactam (ZOSYN) IVPB 2.25 g     2.25 g 100 mL/hr over 30 Minutes Intravenous Every 8 hours 10/17/17 2019         Assessment/Plan  RUQ abdominal pain -no gallstones noted on MRCP -HIDA is negative for acalculous cholecystitis -no plans for surgical intervention given the above findings.  Unclear source of her discomfort. -will defer further evaluation and recommendations to GI given multiple calcifications noted on MRCP.  Unsure if that is source of her symptoms. -we will sign  off.    LOS: 2 days    Henreitta Cea , [facility] 10/19/2017, 9:52 AM Pager: [phone]

## 2017-10-19 NOTE — Progress Notes (Signed)
[facility] Progress Note   Chief Complaint:   Right sided abdominal pain and N/V   SUBJECTIVE:    no significant abdominal pain today. No nausea.    ASSESSMENT AND PLAN:   Right sided abdominal pain, abnormal liver labs and dilated CBD. MRCP negative for cholelithiasis / choledocholithiasis or obstructing mass. There was concern for acute acalculous cholecystitis. Follow up HIDA >> patent cystic duct and CBD. -Liver tests improving. -with CBD dilation / abnormal liver tests did she pass sludge?  -Surgery signed off. No plans for cholecystectomy.  -Her pain has significantly improved. At this point would resume diet and see how she does.  -office follow with Dr. Rush Landmark on 11/19/17 at 4pm  OBJECTIVE:     Vital signs in last 24 hours: Temp:  [98 F (36.7 C)-98.3 F (36.8 C)] 98.3 F (36.8 C) (10/18 0604) Pulse Rate:  [58-64] 58 (10/18 0604) Resp:  [16-17] 16 (10/18 0604) BP: (128-136)/(66-68) 136/66 (10/18 0604) SpO2:  [99 %-100 %] 99 % (10/18 0604) Last BM Date: 10/18/17 General:   Alert, well-developed, female in NAD. Sister and daughter in room EENT:  Normal hearing, Heart:  Regular rate and rhythm Pulm:  Normal respiratory effort Abdomen:  Soft, nondistended, mild RUQ tenderness.  Normal bowel sounds, no masses felt.     Neurologic:  Alert and  oriented x4;  grossly normal neurologically. Psych:  Pleasant, cooperative.  Normal mood and affect.   Intake/Output from previous day: 10/17 0701 - 10/18 0700 In: 1364.9 [P.O.:120; I.V.:1091.4; IV Piggyback:153.5] Out: 300 [Urine:300] Intake/Output this shift: Total I/O In: 0  Out: 300 [Urine:300]  Lab Results: Recent Labs    10/17/17 1827 10/18/17 0217 10/19/17 0248 10/19/17 0813  WBC 15.3* 14.2* 10.6*  --   HGB 9.4* 9.0* 7.7* 7.6*  HCT 26.6* 26.2* 22.1* 22.2*  PLT 169 168 147*  --    BMET Recent Labs    10/17/17 1827 10/18/17 0217 10/19/17 0248  NA 138 139 135  K 3.7 3.4* 4.0    CL 107 108 110  CO2 19* 18* 16*  GLUCOSE 106* 108* 183*  BUN 49* 44* 44*  CREATININE 3.75* 3.50* 3.52*  CALCIUM 8.3* 8.4* 8.2*   LFT Recent Labs    10/18/17 0217 10/19/17 0248  PROT 6.8 6.0*  ALBUMIN 3.2* 2.7*  AST 102* 56*  ALT 140* 99*  ALKPHOS 150* 123  BILITOT 5.3* 2.2*  BILIDIR 3.8*  --     Nm Hepatobiliary Liver Func  Result Date: 10/18/2017 CLINICAL DATA:  Right upper quadrant pain and vomiting for several days. EXAM: NUCLEAR MEDICINE HEPATOBILIARY IMAGING TECHNIQUE: Sequential images of the abdomen were obtained out to 60 minutes following intravenous administration of radiopharmaceutical. Because no gallbladder activity was seen, 2.4 mg morphine was administered intravenously, and imaging was continued for another 30 minutes. RADIOPHARMACEUTICALS:  8.1 mCi Tc-78m  Choletec IV COMPARISON:  None. FINDINGS: Prompt uptake and biliary excretion of activity by the liver is seen. Gallbladder activity is visualized following morphine administration, consistent with patency of cystic duct. Biliary activity passes into small bowel, consistent with patent common bile duct. IMPRESSION: Patency of cystic duct and common bile duct are demonstrated. Electronically Signed   By: Earle Gell M.D.   On: 10/18/2017 19:16   US Renal  Result Date: 10/19/2017 CLINICAL DATA:  Acute kidney injury EXAM: RENAL / URINARY TRACT ULTRASOUND COMPLETE COMPARISON:  CT 10/17/2017 FINDINGS: Right Kidney: Length: 10.5 cm. Cortical thinning and increased echotexture. No hydronephrosis. 1.4 cm  lower pole cyst. Left Kidney: Length: 10.2 cm. Cortical thinning and increased echotexture. No hydronephrosis. Bladder: Appears normal for degree of bladder distention. IMPRESSION: Cortical thinning and mildly increased echotexture bilaterally compatible with chronic medical renal disease. No acute findings. Electronically Signed   By: Rolm Baptise M.D.   On: 10/19/2017 10:02   Mr Abdomen Mrcp Wo Contrast  Result Date:  10/18/2017 CLINICAL DATA:  Abnormal liver function test. History of colon cancer status post right hemicolectomy. Gallbladder distension without gallstone. EXAM: MRI ABDOMEN WITHOUT CONTRAST  (INCLUDING MRCP) TECHNIQUE: Multiplanar multisequence MR imaging of the abdomen was performed. Heavily T2-weighted images of the biliary and pancreatic ducts were obtained, and three-dimensional MRCP images were rendered by post processing. COMPARISON:  CT 10/17/2017 and ultrasound 10/17/2017 FINDINGS: Lower chest: No acute findings. Hepatobiliary: There is no focal liver abnormality identified. The gallbladder appears markedly distended measuring approximately 7.3 by 6.2 by 7.2 cm (volume = 170 cm^3). Mild diffuse gallbladder wall thickening is identified with mild pericholecystic fluid. Increase caliber of the common bile duct measures up to 1.3 cm. Sludge within the common bile duct is identified but no choledocholithiasis. Pancreas: There is diffuse main duct dilatation with a large calcification identified within the main duct within the mid tail measuring 7 mm. Multiple calcifications are identified throughout the main duct. The largest is in the tail of pancreas measuring up to 9 mm, image 14/12. No peripancreatic fat stranding or free fluid identified. Spleen:  Within normal limits in size and appearance. Adrenals/Urinary Tract: Normal adrenal glands. Bilateral kidney cysts are identified. The largest is in the inferior pole of right kidney measuring 1.4 cm. Stomach/Bowel: Moderate distension of the stomach. The visualized proximal and mid small bowel loops are unremarkable. The patient is status post right hemicolectomy. Enterocolonic anastomosis within the right upper quadrant of the abdomen is identified. There is soft tissue stranding and free fluid surrounding the entero colonic anastomosis. Vascular/Lymphatic: Normal appearance of the abdominal aorta. No enlarged retroperitoneal lymph nodes. Other: There is a  small volume of ascites extending over the right lobe of liver. There is a tiny peritoneal nodule overlying the lateral segment of left lobe of liver, image 15/5. Small soft tissue nodule adjacent to scratch set small peritoneal nodule adjacent to the gastric antrum is noted, image 31/5. Musculoskeletal: No suspicious bone lesions identified. IMPRESSION: 1. Again seen is diffuse gallbladder distension, gallbladder wall edema and mild pericholecystic fluid. No gallstones identified. Cannot exclude acute acalculous cholecystitis. 2. Increase caliber of the common bile duct without choledocholithiasis or obstructing mass. 3. There is abnormal free fluid and soft tissue stranding in the right upper quadrant of the abdomen. In the setting of acute cholecystitis this likely reflects inflammatory changes. 4. Two tiny peritoneal nodules are identified, nonspecific. In a patient who has a history of colon cancer clinical correlation is advised within additional follow-up imaging in 3-6 months to ensure stability of these lesions to exclude recurrent tumor/peritoneal disease. Electronically Signed   By: Kerby Moors M.D.   On: 10/18/2017 09:39   Mr 3d Recon At Scanner  Result Date: 10/18/2017 CLINICAL DATA:  Abnormal liver function test. History of colon cancer status post right hemicolectomy. Gallbladder distension without gallstone. EXAM: MRI ABDOMEN WITHOUT CONTRAST  (INCLUDING MRCP) TECHNIQUE: Multiplanar multisequence MR imaging of the abdomen was performed. Heavily T2-weighted images of the biliary and pancreatic ducts were obtained, and three-dimensional MRCP images were rendered by post processing. COMPARISON:  CT 10/17/2017 and ultrasound 10/17/2017 FINDINGS: Lower chest: No acute findings.  Hepatobiliary: There is no focal liver abnormality identified. The gallbladder appears markedly distended measuring approximately 7.3 by 6.2 by 7.2 cm (volume = 170 cm^3). Mild diffuse gallbladder wall thickening is  identified with mild pericholecystic fluid. Increase caliber of the common bile duct measures up to 1.3 cm. Sludge within the common bile duct is identified but no choledocholithiasis. Pancreas: There is diffuse main duct dilatation with a large calcification identified within the main duct within the mid tail measuring 7 mm. Multiple calcifications are identified throughout the main duct. The largest is in the tail of pancreas measuring up to 9 mm, image 14/12. No peripancreatic fat stranding or free fluid identified. Spleen:  Within normal limits in size and appearance. Adrenals/Urinary Tract: Normal adrenal glands. Bilateral kidney cysts are identified. The largest is in the inferior pole of right kidney measuring 1.4 cm. Stomach/Bowel: Moderate distension of the stomach. The visualized proximal and mid small bowel loops are unremarkable. The patient is status post right hemicolectomy. Enterocolonic anastomosis within the right upper quadrant of the abdomen is identified. There is soft tissue stranding and free fluid surrounding the entero colonic anastomosis. Vascular/Lymphatic: Normal appearance of the abdominal aorta. No enlarged retroperitoneal lymph nodes. Other: There is a small volume of ascites extending over the right lobe of liver. There is a tiny peritoneal nodule overlying the lateral segment of left lobe of liver, image 15/5. Small soft tissue nodule adjacent to scratch set small peritoneal nodule adjacent to the gastric antrum is noted, image 31/5. Musculoskeletal: No suspicious bone lesions identified. IMPRESSION: 1. Again seen is diffuse gallbladder distension, gallbladder wall edema and mild pericholecystic fluid. No gallstones identified. Cannot exclude acute acalculous cholecystitis. 2. Increase caliber of the common bile duct without choledocholithiasis or obstructing mass. 3. There is abnormal free fluid and soft tissue stranding in the right upper quadrant of the abdomen. In the setting of  acute cholecystitis this likely reflects inflammatory changes. 4. Two tiny peritoneal nodules are identified, nonspecific. In a patient who has a history of colon cancer clinical correlation is advised within additional follow-up imaging in 3-6 months to ensure stability of these lesions to exclude recurrent tumor/peritoneal disease. Electronically Signed   By: Kerby Moors M.D.   On: 10/18/2017 09:39     LOS: 2 days   Tye Savoy ,NP 10/19/2017, 1:16 PM

## 2017-10-19 NOTE — Care Management Important Message (Signed)
Important Message  Patient Details  Name: Danielle Bright MRN: [id number] Date of Birth: 1927-12-27   Medicare Important Message Given:  Yes    [name] 10/19/2017, 1:57 PM

## 2017-10-19 NOTE — Plan of Care (Signed)
  Problem: Nutrition: Goal: Adequate nutrition will be maintained Outcome: Progressing   

## 2017-10-19 NOTE — Progress Notes (Signed)
Patient CBG 54 regular can of Sprite and 4g. Dextrose tablet given. Will continue to monitor. Arthor Captain LPN

## 2017-10-19 NOTE — Progress Notes (Signed)
PROGRESS NOTE    Danielle Bright  BDZ:[id number] DOB: 14-Oct-1927 DOA: 10/17/2017 PCP: [name], MD   Brief Narrative:  HPI on 10/17/2017 by Dr. Florencia [name] is a 84 y.o. female  With h/o colon cancer s/p right hemicolectomy 98yrs ago,  HTN, insulin dependent DM2, CKDIII, mild dementia on Namenda who has been independent presented to [facility] ED due to n/v, ab pain since Sunday, she reports not able to keep anything down. She denies fever. Reports stool have been dark for a few months.  CT AB  Showed marked distension of gallbladder with a possible 0.3cm stone at the ampullar of vater. cbd dialated at 1.1cm, Multiple calcifications throughout pancreatis duct and in the parenchyma of the pancrease. Labs, wbc 16, hgb 10.3, bun 55, cr 3.70, tbili 4.8. She received 2liter ns and zosyn, EDP contacted [facility] who accepted patient to transfer to [place]. hospitalist to admit, [facility] to consult.  Currently patient denies pain, no active n/v. No fever.  Interim history Admtted for abdominal pain due to GB disease. GI and general surgery consulted. HIDA scan obtained, negative for acalculous cholecystitis. Gen surg signed off.   Assessment & Plan   Abdominal pain secondary to related common bile duct without choledocholithiasis -?Acalculous Cholecystitis -Currently on Zosyn -MRCP: Diffuse gallbladder distention, gallbladder wall edema and mild pericholecystic fluid.  No gallstones identified.  Cannot exclude acute acalculous cholecystitis.  Increased caliber of the common bile duct without choledocholithiasis or obstructing mass. -Gastroenterology consulted and appreciated, general surgery consult -Patient did have elevated LFTs, improving and trending downward -Continue IV fluid and supportive care with pain management, antiemetics -HIDA: patency of cystic duct and CBD are demonstrated  -General surgery consulted and appreciated, given HIDA results, no surgical intervention. Surg  signed off -pending further recommendations from GI  Acute kidney injury on chronic kidney disease, stage IV -Per review of care everywhere, it seems that patient's GFR has been in stage IV level since 2018 -Baseline creatinine is approximately 1.9-2 -Creatinine on admission was 3.75, mildly improved to 3.5 this morning -UA negative for infection -Continue to monitor BMP, avoid nephrotoxic agents -Discussed with [name], nephrology, no further intervention at this time -patient follows with a nephrologist in [place], [place]  Anemia, microcytic / Anemia of chronic disease -Reported having dark stools for several months -Hemoglobin currently 7.7, suspect drop in hemoglobin may be dilutional given that patient as receiving IVF -no evidence of bleeding  -Will discontinue IVF and monitor CBC -Vitamin B12 level 160, folate level 18.1 -Given vitamin B12 supplementation  Diabetes mellitus, type II -Continue insulin sliding scale with CBG monitoring -hemoglobin A1c 8.5  Essential Hypertension -Home meds currently held, continue hydralazine as needed  Dementia/memory impairment -Continue Namenda  History of colon cancer -Need to have peritoneal nodules on MR, suggest repeating imaging in 3 to 6 months  Hypokalemia  -Resolved with replacement -continue to monitor BMP  DVT Prophylaxis  SCDs  Code Status: Full  Family Communication: Daughter at bedside  Disposition Plan: Admitted. Pending recommendations from gastroenterology.   Consultants Gastroenterology General surgery  Procedures  MRCP  Antibiotics   Anti-infectives (From admission, onward)   Start     Dose/Rate Route Frequency Ordered Stop   10/17/17 2100  piperacillin-tazobactam (ZOSYN) IVPB 2.25 g     2.25 g 100 mL/hr over 30 Minutes Intravenous Every 8 hours 10/17/17 2019        Subjective:   Danielle Bright seen and examined today.  Denies current abdominal  pain, nausea, vomiting. Denies current chest pain,  shortness of breath, dizziness, headache.    Objective:   Vitals:   10/18/17 0206 10/18/17 0520 10/18/17 2051 10/19/17 0604  BP: (!) 157/69 (!) 165/71 128/68 136/66  Pulse: 62 63 64 (!) 58  Resp: 18 17 17 16   Temp: 98.2 F (36.8 C) 98.3 F (36.8 C) 98 F (36.7 C) 98.3 F (36.8 C)  TempSrc: Oral Oral Oral Oral  SpO2: 95% 95% 100% 99%  Weight:      Height:        Intake/Output Summary (Last 24 hours) at 10/19/2017 1252 Last data filed at 10/19/2017 1232 Gross per 24 hour  Intake 604.67 ml  Output 300 ml  Net 304.67 ml   Filed Weights   10/17/17 1617  Weight: 59.5 kg   Exam  General: Well developed, well nourished, NAD, appears stated age  68: NCAT, mucous membranes moist.   Neck: Supple  Cardiovascular: S1 S2 auscultated, no murmurs, RRR.  Respiratory: Clear to auscultation bilaterally with equal chest rise  Abdomen: Soft, mild RUQ TTP, nondistended, + bowel sounds  Extremities: warm dry without cyanosis clubbing or edema  Neuro: AAOx3, nonfocal  Psych: Normal affect and demeanor, pleasant   Data Reviewed: I have personally reviewed following labs and imaging studies  CBC: Recent Labs  Lab 10/17/17 1827 10/18/17 0217 10/19/17 0248 10/19/17 0813  WBC 15.3* 14.2* 10.6*  --   NEUTROABS 12.9* 12.2*  --   --   HGB 9.4* 9.0* 7.7* 7.6*  HCT 26.6* 26.2* 22.1* 22.2*  MCV 73.3* 73.6* 74.2*  --   PLT 169 168 147*  --    Basic Metabolic Panel: Recent Labs  Lab 10/17/17 1827 10/18/17 0217 10/19/17 0248  NA 138 139 135  K 3.7 3.4* 4.0  CL 107 108 110  CO2 19* 18* 16*  GLUCOSE 106* 108* 183*  BUN 49* 44* 44*  CREATININE 3.75* 3.50* 3.52*  CALCIUM 8.3* 8.4* 8.2*   GFR: Estimated Creatinine Clearance: 10.3 mL/min (A) (by C-G formula based on SCr of 3.52 mg/dL (H)). Liver Function Tests: Recent Labs  Lab 10/17/17 1827 10/18/17 0217 10/19/17 0248  AST 114* 102* 56*  ALT 155* 140* 99*  ALKPHOS 156* 150* 123  BILITOT 5.5* 5.3* 2.2*  PROT 7.1  6.8 6.0*  ALBUMIN 3.4* 3.2* 2.7*   Recent Labs  Lab 10/17/17 1827  LIPASE 109*   No results for input(s): AMMONIA in the last 168 hours. Coagulation Profile: No results for input(s): INR, PROTIME in the last 168 hours. Cardiac Enzymes: No results for input(s): CKTOTAL, CKMB, CKMBINDEX, TROPONINI in the last 168 hours. BNP (last 3 results) No results for input(s): PROBNP in the last 8760 hours. HbA1C: Recent Labs    10/18/17 0217  HGBA1C 8.5*   CBG: Recent Labs  Lab 10/18/17 0921 10/18/17 1228 10/18/17 1932 10/19/17 0813 10/19/17 1223  GLUCAP 128* 126* 81 134* 146*   Lipid Profile: No results for input(s): CHOL, HDL, LDLCALC, TRIG, CHOLHDL, LDLDIRECT in the last 72 hours. Thyroid Function Tests: Recent Labs    10/18/17 0217  TSH 0.339*   Anemia Panel: Recent Labs    10/18/17 0217  VITAMINB12 160*  FOLATE 16.1  RETICCTPCT 1.2   Urine analysis:    Component Value Date/Time   COLORURINE AMBER (A) 10/17/2017 2227   APPEARANCEUR CLEAR 10/17/2017 2227   LABSPEC 1.012 10/17/2017 2227   PHURINE 5.0 10/17/2017 2227   GLUCOSEU NEGATIVE 10/17/2017 2227   HGBUR SMALL (A) 10/17/2017 2227  BILIRUBINUR NEGATIVE 10/17/2017 Jenn[place] 10/17/2017 2227   PROTEINUR 30 (A) 10/17/2017 2227   NITRITE NEGATIVE 10/17/2017 2227   LEUKOCYTESUR NEGATIVE 10/17/2017 2227   Sepsis Labs: @LABRCNTIP (procalcitonin:4,lacticidven:4)  )No results found for this or any previous visit (from the past 240 hour(s)).    Radiology Studies: Nm Hepatobiliary Liver Func  Result Date: 10/18/2017 CLINICAL DATA:  Right upper quadrant pain and vomiting for several days. EXAM: NUCLEAR MEDICINE HEPATOBILIARY IMAGING TECHNIQUE: Sequential images of the abdomen were obtained out to 60 minutes following intravenous administration of radiopharmaceutical. Because no gallbladder activity was seen, 2.4 mg morphine was administered intravenously, and imaging was continued for another 30  minutes. RADIOPHARMACEUTICALS:  8.1 mCi Tc-56m  Choletec IV COMPARISON:  None. FINDINGS: Prompt uptake and biliary excretion of activity by the liver is seen. Gallbladder activity is visualized following morphine administration, consistent with patency of cystic duct. Biliary activity passes into small bowel, consistent with patent common bile duct. IMPRESSION: Patency of cystic duct and common bile duct are demonstrated. Electronically Signed   By: Earle Gell M.D.   On: 10/18/2017 19:16   US Renal  Result Date: 10/19/2017 CLINICAL DATA:  Acute kidney injury EXAM: RENAL / URINARY TRACT ULTRASOUND COMPLETE COMPARISON:  CT 10/17/2017 FINDINGS: Right Kidney: Length: 10.5 cm. Cortical thinning and increased echotexture. No hydronephrosis. 1.4 cm lower pole cyst. Left Kidney: Length: 10.2 cm. Cortical thinning and increased echotexture. No hydronephrosis. Bladder: Appears normal for degree of bladder distention. IMPRESSION: Cortical thinning and mildly increased echotexture bilaterally compatible with chronic medical renal disease. No acute findings. Electronically Signed   By: Rolm Baptise M.D.   On: 10/19/2017 10:02   Mr Abdomen Mrcp Wo Contrast  Result Date: 10/18/2017 CLINICAL DATA:  Abnormal liver function test. History of colon cancer status post right hemicolectomy. Gallbladder distension without gallstone. EXAM: MRI ABDOMEN WITHOUT CONTRAST  (INCLUDING MRCP) TECHNIQUE: Multiplanar multisequence MR imaging of the abdomen was performed. Heavily T2-weighted images of the biliary and pancreatic ducts were obtained, and three-dimensional MRCP images were rendered by post processing. COMPARISON:  CT 10/17/2017 and ultrasound 10/17/2017 FINDINGS: Lower chest: No acute findings. Hepatobiliary: There is no focal liver abnormality identified. The gallbladder appears markedly distended measuring approximately 7.3 by 6.2 by 7.2 cm (volume = 170 cm^3). Mild diffuse gallbladder wall thickening is identified with  mild pericholecystic fluid. Increase caliber of the common bile duct measures up to 1.3 cm. Sludge within the common bile duct is identified but no choledocholithiasis. Pancreas: There is diffuse main duct dilatation with a large calcification identified within the main duct within the mid tail measuring 7 mm. Multiple calcifications are identified throughout the main duct. The largest is in the tail of pancreas measuring up to 9 mm, image 14/12. No peripancreatic fat stranding or free fluid identified. Spleen:  Within normal limits in size and appearance. Adrenals/Urinary Tract: Normal adrenal glands. Bilateral kidney cysts are identified. The largest is in the inferior pole of right kidney measuring 1.4 cm. Stomach/Bowel: Moderate distension of the stomach. The visualized proximal and mid small bowel loops are unremarkable. The patient is status post right hemicolectomy. Enterocolonic anastomosis within the right upper quadrant of the abdomen is identified. There is soft tissue stranding and free fluid surrounding the entero colonic anastomosis. Vascular/Lymphatic: Normal appearance of the abdominal aorta. No enlarged retroperitoneal lymph nodes. Other: There is a small volume of ascites extending over the right lobe of liver. There is a tiny peritoneal nodule overlying the lateral segment of left lobe  of liver, image 15/5. Small soft tissue nodule adjacent to scratch set small peritoneal nodule adjacent to the gastric antrum is noted, image 31/5. Musculoskeletal: No suspicious bone lesions identified. IMPRESSION: 1. Again seen is diffuse gallbladder distension, gallbladder wall edema and mild pericholecystic fluid. No gallstones identified. Cannot exclude acute acalculous cholecystitis. 2. Increase caliber of the common bile duct without choledocholithiasis or obstructing mass. 3. There is abnormal free fluid and soft tissue stranding in the right upper quadrant of the abdomen. In the setting of acute  cholecystitis this likely reflects inflammatory changes. 4. Two tiny peritoneal nodules are identified, nonspecific. In a patient who has a history of colon cancer clinical correlation is advised within additional follow-up imaging in 3-6 months to ensure stability of these lesions to exclude recurrent tumor/peritoneal disease. Electronically Signed   By: Kerby Moors M.D.   On: 10/18/2017 09:39   Mr 3d Recon At Scanner  Result Date: 10/18/2017 CLINICAL DATA:  Abnormal liver function test. History of colon cancer status post right hemicolectomy. Gallbladder distension without gallstone. EXAM: MRI ABDOMEN WITHOUT CONTRAST  (INCLUDING MRCP) TECHNIQUE: Multiplanar multisequence MR imaging of the abdomen was performed. Heavily T2-weighted images of the biliary and pancreatic ducts were obtained, and three-dimensional MRCP images were rendered by post processing. COMPARISON:  CT 10/17/2017 and ultrasound 10/17/2017 FINDINGS: Lower chest: No acute findings. Hepatobiliary: There is no focal liver abnormality identified. The gallbladder appears markedly distended measuring approximately 7.3 by 6.2 by 7.2 cm (volume = 170 cm^3). Mild diffuse gallbladder wall thickening is identified with mild pericholecystic fluid. Increase caliber of the common bile duct measures up to 1.3 cm. Sludge within the common bile duct is identified but no choledocholithiasis. Pancreas: There is diffuse main duct dilatation with a large calcification identified within the main duct within the mid tail measuring 7 mm. Multiple calcifications are identified throughout the main duct. The largest is in the tail of pancreas measuring up to 9 mm, image 14/12. No peripancreatic fat stranding or free fluid identified. Spleen:  Within normal limits in size and appearance. Adrenals/Urinary Tract: Normal adrenal glands. Bilateral kidney cysts are identified. The largest is in the inferior pole of right kidney measuring 1.4 cm. Stomach/Bowel: Moderate  distension of the stomach. The visualized proximal and mid small bowel loops are unremarkable. The patient is status post right hemicolectomy. Enterocolonic anastomosis within the right upper quadrant of the abdomen is identified. There is soft tissue stranding and free fluid surrounding the entero colonic anastomosis. Vascular/Lymphatic: Normal appearance of the abdominal aorta. No enlarged retroperitoneal lymph nodes. Other: There is a small volume of ascites extending over the right lobe of liver. There is a tiny peritoneal nodule overlying the lateral segment of left lobe of liver, image 15/5. Small soft tissue nodule adjacent to scratch set small peritoneal nodule adjacent to the gastric antrum is noted, image 31/5. Musculoskeletal: No suspicious bone lesions identified. IMPRESSION: 1. Again seen is diffuse gallbladder distension, gallbladder wall edema and mild pericholecystic fluid. No gallstones identified. Cannot exclude acute acalculous cholecystitis. 2. Increase caliber of the common bile duct without choledocholithiasis or obstructing mass. 3. There is abnormal free fluid and soft tissue stranding in the right upper quadrant of the abdomen. In the setting of acute cholecystitis this likely reflects inflammatory changes. 4. Two tiny peritoneal nodules are identified, nonspecific. In a patient who has a history of colon cancer clinical correlation is advised within additional follow-up imaging in 3-6 months to ensure stability of these lesions to exclude recurrent tumor/peritoneal disease.  Electronically Signed   By: Kerby Moors M.D.   On: 10/18/2017 09:39     Scheduled Meds: . insulin aspart  0-15 Units Subcutaneous TID WC   Continuous Infusions: . dextrose 5 % and 0.9% NaCl 75 mL/hr at 10/19/17 0635  . piperacillin-tazobactam (ZOSYN)  IV 2.25 g (10/19/17 0636)     LOS: 2 days   Time Spent in minutes   30 minutes  Davanta Meuser D.O. on 10/19/2017 at 12:52 PM  Between 7am to 7pm -  Please see pager noted on [email or web address]  After 7pm go to [email or web address]  And look for the night coverage person covering for me after hours  [facility] Office  [phone]

## 2017-10-19 NOTE — Care Management Important Message (Signed)
Important Message  Patient Details  Name: Danielle Bright MRN: [id number] Date of Birth: [birth year]   Medicare Important Message Given:  Yes    Abhishek Levesque 10/19/2017, 1:55 PM

## 2017-10-19 NOTE — Progress Notes (Signed)
Pharmacy Antibiotic Note  Danielle Bright is a 84 y.o. female admitted on 10/17/2017 with Intra-abdominal infection.  Pharmacy has been consulted for Zosyn dosing.  ID: Zosyn D#3 for intra-abdominal infxn - Afebrile, WBC 10.6 down, SCr 3.75>3.52. No cultures.  Zosyn 10/16>>  Plan: Continue zosyn 2.25g IV Q8H F/u restart home meds Pharmacy will sign off. Please reconsult for further dosing assitance.   Height: 5\' 6"  (167.6 cm) Weight: 131 lb 2.8 oz (59.5 kg) IBW/kg (Calculated) : 59.3  Temp (24hrs), Avg:98.2 F (36.8 C), Min:98 F (36.7 C), Max:98.3 F (36.8 C)  Recent Labs  Lab 10/17/17 1827 10/18/17 0217 10/19/17 0248  WBC 15.3* 14.2* 10.6*  CREATININE 3.75* 3.50* 3.52*    Estimated Creatinine Clearance: 10.3 mL/min (A) (by C-G formula based on SCr of 3.52 mg/dL (H)).    No Known Allergies   Danielle Bright, PharmD, BCPS Clinical Staff Pharmacist [phone]  Danielle Bright 10/19/2017 12:16 PM

## 2017-10-20 LAB — COMPREHENSIVE METABOLIC PANEL
ALT: 68 U/L — ABNORMAL HIGH (ref 0–44)
ANION GAP: 9 (ref 5–15)
AST: 28 U/L (ref 15–41)
Albumin: 2.5 g/dL — ABNORMAL LOW (ref 3.5–5.0)
Alkaline Phosphatase: 107 U/L (ref 38–126)
BUN: 34 mg/dL — ABNORMAL HIGH (ref 8–23)
CHLORIDE: 112 mmol/L — AB (ref 98–111)
CO2: 15 mmol/L — AB (ref 22–32)
Calcium: 8 mg/dL — ABNORMAL LOW (ref 8.9–10.3)
Creatinine, Ser: 2.9 mg/dL — ABNORMAL HIGH (ref 0.44–1.00)
GFR calc Af Amer: 16 mL/min — ABNORMAL LOW (ref >60)
GFR calc non Af Amer: 13 mL/min — ABNORMAL LOW (ref >60)
Glucose, Bld: 91 mg/dL (ref 70–99)
POTASSIUM: 4 mmol/L (ref 3.5–5.1)
SODIUM: 136 mmol/L (ref 135–145)
Total Bilirubin: 1.3 mg/dL — ABNORMAL HIGH (ref 0.3–1.2)
Total Protein: 5.9 g/dL — ABNORMAL LOW (ref 6.5–8.1)

## 2017-10-20 LAB — CBC
HEMATOCRIT: 22.1 % — AB (ref 36.0–46.0)
Hemoglobin: 7.5 g/dL — ABNORMAL LOW (ref 12.0–15.0)
MCH: 25.3 pg — ABNORMAL LOW (ref 26.0–34.0)
MCHC: 33.9 g/dL (ref 30.0–36.0)
MCV: 74.7 fL — AB (ref 80.0–100.0)
Platelets: 167 10*3/uL (ref 150–400)
RBC: 2.96 MIL/uL — AB (ref 3.87–5.11)
RDW: 13.8 % (ref 11.5–15.5)
WBC: 8.1 10*3/uL (ref 4.0–10.5)
nRBC: 0 % (ref 0.0–0.2)

## 2017-10-20 LAB — PROTIME-INR
INR: 1.53
Prothrombin Time: 18.2 seconds — ABNORMAL HIGH (ref 11.4–15.2)

## 2017-10-20 LAB — GLUCOSE, CAPILLARY
GLUCOSE-CAPILLARY: 164 mg/dL — AB (ref 70–99)
GLUCOSE-CAPILLARY: 183 mg/dL — AB (ref 70–99)
Glucose-Capillary: 106 mg/dL — ABNORMAL HIGH (ref 70–99)
Glucose-Capillary: 117 mg/dL — ABNORMAL HIGH (ref 70–99)

## 2017-10-20 MED ORDER — AMLODIPINE BESYLATE 5 MG PO TABS
5.0000 mg | ORAL_TABLET | Freq: Every day | ORAL | Status: DC
  Administered 2017-10-20 – 2017-10-23 (×4): 5 mg via ORAL
  Filled 2017-10-20 (×4): qty 1

## 2017-10-20 MED ORDER — NEBIVOLOL HCL 10 MG PO TABS
20.0000 mg | ORAL_TABLET | Freq: Every morning | ORAL | Status: DC
  Administered 2017-10-20 – 2017-10-23 (×4): 20 mg via ORAL
  Filled 2017-10-20 (×4): qty 2

## 2017-10-20 NOTE — Progress Notes (Signed)
Physical Therapy Evaluation Patient Details Name: MANJOT BEUMER MRN: [id number] DOB: [birth year] Today's Date: 10/20/2017   History of Present Illness  Patient presented to the hospoutal with abdom,inal pain. Her pain has improved since she has been at the hospital but she is feeling weak compared to how she was feeling before she started having pain. PMH DMII, cancer, arthritis   Clinical Impression  Patient presents with decreased endurance compared to baseline. She lives alone and has limited help 3 days a week. At this time she would benefit from skilled therapy to improve endurance. She would benefit from rehab at a SNF to improve endurance and ability to return home safely.     Follow Up Recommendations SNF    Equipment Recommendations  Rolling walker with 5" wheels    Recommendations for Other Services Rehab consult     Precautions / Restrictions Precautions Precautions: None Restrictions Weight Bearing Restrictions: No      Mobility  Bed Mobility Overal bed mobility: Needs Assistance Bed Mobility: Supine to Sit     Supine to sit: Supervision     General bed mobility comments: Patient found in chair but per nuring patient required supervision   Transfers Overall transfer level: Needs assistance   Transfers: Sit to/from Stand Sit to Stand: Min assist         General transfer comment: Min a for strength and min a for inital balance when standing. Once standing min gaurd   Ambulation/Gait Ambulation/Gait assistance: Min guard Gait Distance (Feet): 40 Feet Assistive device: Rolling walker (2 wheeled) Gait Pattern/deviations: Step-through pattern;Decreased step length - right;Decreased step length - left Gait velocity: decreased   General Gait Details: slow but steady gait pattern. Patient reported fatigue after ambulation   Stairs            Wheelchair Mobility    Modified Rankin (Stroke Patients Only)       Balance                                             Pertinent Vitals/Pain Pain Assessment: 0-10 Pain Score: 0-No pain Pain Intervention(s): Monitored during session    Home Living Family/patient expects to be discharged to:: Skilled nursing facility Living Arrangements: Alone Available Help at Discharge: Home health   Home Access: Stairs to enter   Entrance Stairs-Number of Steps: 3     Additional Comments: home health aide comes in three times a week.     Prior Function Level of Independence: Independent         Comments: used a cane at times and used the walls in the house.     Hand Dominance   Dominant Hand: Right    Extremity/Trunk Assessment   Upper Extremity Assessment Upper Extremity Assessment: Overall WFL for tasks assessed    Lower Extremity Assessment Lower Extremity Assessment: Overall WFL for tasks assessed    Cervical / Trunk Assessment Cervical / Trunk Assessment: Normal  Communication   Communication: No difficulties  Cognition Arousal/Alertness: Awake/alert Behavior During Therapy: WFL for tasks assessed/performed Overall Cognitive Status: Within Functional Limits for tasks assessed                                        General Comments      Exercises  Assessment/Plan    PT Assessment Patient needs continued PT services  PT Problem List Decreased strength;Decreased range of motion;Decreased activity tolerance;Decreased balance;Decreased mobility       PT Treatment Interventions DME instruction;Gait training;Stair training;Functional mobility training;Therapeutic activities;Therapeutic exercise;Neuromuscular re-education;Patient/family education    PT Goals (Current goals can be found in the Care Plan section)  Acute Rehab PT Goals Patient Stated Goal: to get stronger PT Goal Formulation: With patient Time For Goal Achievement: 10/27/17 Potential to Achieve Goals: Good    Frequency Min 3X/week   Barriers to discharge  Decreased caregiver support Only has a caregiver 3x a week for a limited time     Co-evaluation               AM-PAC PT "6 Clicks" Daily Activity  Outcome Measure Difficulty turning over in bed (including adjusting bedclothes, sheets and blankets)?: A Little Difficulty moving from lying on back to sitting on the side of the bed? : A Little Difficulty sitting down on and standing up from a chair with arms (e.g., wheelchair, bedside commode, etc,.)?: A Little Help needed moving to and from a bed to chair (including a wheelchair)?: A Little Help needed walking in hospital room?: A Lot Help needed climbing 3-5 steps with a railing? : A Lot 6 Click Score: 16    End of Session Equipment Utilized During Treatment: Gait belt Activity Tolerance: Patient tolerated treatment well Patient left: in chair;with call bell/phone within reach Nurse Communication: Mobility status PT Visit Diagnosis: Unsteadiness on feet (R26.81);Muscle weakness (generalized) (M62.81)    Time: 0940-1000 PT Time Calculation (min) (ACUTE ONLY): 20 min   Charges:   PT Evaluation $PT Eval Low Complexity: 1 Low          Carney Living PT DPT  10/20/2017, 10:56 AM

## 2017-10-20 NOTE — Progress Notes (Signed)
Progress Note   Subjective  Continues to feel better, denies abdominal pain No fevers No nausea or vomiting   Objective   Vital signs in last 24 hours: Temp:  [97.3 F (36.3 C)-98.3 F (36.8 C)] 97.3 F (36.3 C) (10/19 0454) Pulse Rate:  [58-72] 58 (10/19 0454) Resp:  [16-18] 17 (10/19 0454) BP: (144-160)/(63-69) 160/69 (10/19 0454) SpO2:  [99 %-100 %] 100 % (10/19 0454) Last BM Date: 10/18/17 Gen: awake, alert, NAD HEENT: anicteric, op clear CV: RRR, no mrg Pulm: CTA b/l Abd: soft, mild RUQ tenderness, no rebound or guarding, ND, +BS throughout Ext: no c/c/e Neuro: nonfocal   Intake/Output from previous day: 10/18 0701 - 10/19 0700 In: 100 [IV Piggyback:100] Out: 550 [Urine:550] Intake/Output this shift: Total I/O In: 118 [P.O.:118] Out: -   Lab Results: Recent Labs    10/18/17 0217 10/19/17 0248 10/19/17 0813 10/20/17 0303  WBC 14.2* 10.6*  --  8.1  HGB 9.0* 7.7* 7.6* 7.5*  HCT 26.2* 22.1* 22.2* 22.1*  PLT 168 147*  --  167   BMET Recent Labs    10/18/17 0217 10/19/17 0248 10/20/17 0303  NA 139 135 136  K 3.4* 4.0 4.0  CL 108 110 112*  CO2 18* 16* 15*  GLUCOSE 108* 183* 91  BUN 44* 44* 34*  CREATININE 3.50* 3.52* 2.90*  CALCIUM 8.4* 8.2* 8.0*   LFT Recent Labs    10/18/17 0217  10/20/17 0303  PROT 6.8   < > 5.9*  ALBUMIN 3.2*   < > 2.5*  AST 102*   < > 28  ALT 140*   < > 68*  ALKPHOS 150*   < > 107  BILITOT 5.3*   < > 1.3*  BILIDIR 3.8*  --   --    < > = values in this interval not displayed.   PT/INR Recent Labs    10/20/17 0303  LABPROT 18.2*  INR 1.53    Studies/Results: Nm Hepatobiliary Liver Func  Result Date: 10/18/2017 CLINICAL DATA:  Right upper quadrant pain and vomiting for several days. EXAM: NUCLEAR MEDICINE HEPATOBILIARY IMAGING TECHNIQUE: Sequential images of the abdomen were obtained out to 60 minutes following intravenous administration of radiopharmaceutical. Because no gallbladder activity was seen, 2.4  mg morphine was administered intravenously, and imaging was continued for another 30 minutes. RADIOPHARMACEUTICALS:  8.1 mCi Tc-68m  Choletec IV COMPARISON:  None. FINDINGS: Prompt uptake and biliary excretion of activity by the liver is seen. Gallbladder activity is visualized following morphine administration, consistent with patency of cystic duct. Biliary activity passes into small bowel, consistent with patent common bile duct. IMPRESSION: Patency of cystic duct and common bile duct are demonstrated. Electronically Signed   By: Earle Gell M.D.   On: 10/18/2017 19:16   US Renal  Result Date: 10/19/2017 CLINICAL DATA:  Acute kidney injury EXAM: RENAL / URINARY TRACT ULTRASOUND COMPLETE COMPARISON:  CT 10/17/2017 FINDINGS: Right Kidney: Length: 10.5 cm. Cortical thinning and increased echotexture. No hydronephrosis. 1.4 cm lower pole cyst. Left Kidney: Length: 10.2 cm. Cortical thinning and increased echotexture. No hydronephrosis. Bladder: Appears normal for degree of bladder distention. IMPRESSION: Cortical thinning and mildly increased echotexture bilaterally compatible with chronic medical renal disease. No acute findings. Electronically Signed   By: Rolm Baptise M.D.   On: 10/19/2017 10:02       Assessment / Plan:   84 year old female with remote colon cancer, hypertension, diabetes, CKD 4, admitted with right upper quadrant pain probable acalculous cholecystitis and  transient biliary obstruction  1.  Right upper quadrant pain/elevated liver enzymes/acalculous cholecystitis --she is improved clinically and her liver enzymes continue to trend down.  There is no evidence for ongoing cholecystitis or bile duct obstruction.  Abdominal pain is improved --Would advance diet --No plan for ERCP and she certainly does not need percutaneous gallbladder/biliary drain --We will follow-up with Dr. Rush Landmark in 1 month for continuity and to discuss MRCP findings  GI will sign off, call with  questions      Active Problems:   Cholecystitis   Abnormal LFTs (liver function tests)   RUQ pain     LOS: 3 days   Jerene Bears  10/20/2017, 11:42 AM

## 2017-10-20 NOTE — Progress Notes (Signed)
PROGRESS NOTE    MEGGIE LASETER  JJO:[id number] DOB: [birth year] DOA: 10/17/2017 PCP: [name], MD   Brief Narrative:  HPI on 10/17/2017 by Dr. Florencia [name] is a 84 y.o. female  With h/o colon cancer s/p right hemicolectomy 14yrs ago,  HTN, insulin dependent DM2, CKDIII, mild dementia on Namenda who has been independent presented to [facility] ED due to n/v, ab pain since Sunday, she reports not able to keep anything down. She denies fever. Reports stool have been dark for a few months.  CT AB  Showed marked distension of gallbladder with a possible 0.3cm stone at the ampullar of vater. cbd dialated at 1.1cm, Multiple calcifications throughout pancreatis duct and in the parenchyma of the pancrease. Labs, wbc 16, hgb 10.3, bun 55, cr 3.70, tbili 4.8. She received 2liter ns and zosyn, EDP contacted [facility] who accepted patient to transfer to [place]. hospitalist to admit, [facility] to consult.  Currently patient denies pain, no active n/v. No fever.  Interim history Admtted for abdominal pain due to GB disease. GI and general surgery consulted. HIDA scan obtained, negative for acalculous cholecystitis. Gen surg signed off.   Assessment & Plan   Abdominal pain secondary to related common bile duct without choledocholithiasis -?Acalculous Cholecystitis -Currently on Zosyn -MRCP: Diffuse gallbladder distention, gallbladder wall edema and mild pericholecystic fluid.  No gallstones identified.  Cannot exclude acute acalculous cholecystitis.  Increased caliber of the common bile duct without choledocholithiasis or obstructing mass. -Gastroenterology consulted and appreciated, general surgery consult -Patient did have elevated LFTs, improving and continuing to trend downward -Continue IV fluid and supportive care with pain management, antiemetics -HIDA: patency of cystic duct and CBD are demonstrated  -General surgery consulted and appreciated, given HIDA results, no surgical  intervention. Surg signed off -Currently on full liquid diet, tolerated well -discussed with GI, [name], can advance to soft diet. Feels this may have been cholecystitis. No further work up or intervention needed at this time, given HIDA scan results and symptoms have improved.   Acute kidney injury on chronic kidney disease, stage IV -Per review of care everywhere, it seems that patient's GFR has been in stage IV level since 2018 -Baseline creatinine is approximately 1.9-2 -Creatinine on admission was 3.75, mildly improved to 2.9 today -UA negative for infection -Continue to monitor BMP, avoid nephrotoxic agents -Discussed with [name], nephrology, no further intervention at this time -patient follows with a nephrologist in [place], [place]  Anemia, microcytic / Anemia of chronic disease -Reported having dark stools for several months -Hemoglobin currently 7.5, suspect drop in hemoglobin may be dilutional given that patient as receiving IVF -no evidence of bleeding  -Discontinued IVF and monitor CBC -Vitamin B12 level 160, folate level 18.1 -Given vitamin B12 supplementation  Diabetes mellitus, type II -Continue insulin sliding scale with CBG monitoring -hemoglobin A1c 8.5  Essential Hypertension -Home meds were held on admission -will restart bystolic and amlodipine today -continue to hold ramipril and HCTZ  Dementia/memory impairment -Continue Namenda  History of colon cancer -Need to have peritoneal nodules on MR, suggest repeating imaging in 3 to 6 months  Hypokalemia  -Resolved with replacement -continue to monitor BMP  DVT Prophylaxis  SCDs  Code Status: Full  Family Communication: noneat bedside  Disposition Plan: Admitted. Pending recommendations from gastroenterology.   Consultants Gastroenterology General surgery  Procedures  MRCP  Antibiotics   Anti-infectives (From admission, onward)   Start     Dose/Rate Route Frequency Ordered Stop  10/17/17 2100  piperacillin-tazobactam (ZOSYN) IVPB 2.25 g     2.25 g 100 mL/hr over 30 Minutes Intravenous Every 8 hours 10/17/17 2019        Subjective:   Leahann Lieb seen and examined today.  Denies current chest pain, shortness of breath, abdominal pain, N/V/D/C, dizziness, headache.  Objective:   Vitals:   10/19/17 0604 10/19/17 1510 10/19/17 2203 10/20/17 0454  BP: 136/66 (!) 144/63 (!) 151/66 (!) 160/69  Pulse: (!) 58 72 62 (!) 58  Resp: 16 18 16 17   Temp: 98.3 F (36.8 C) 98 F (36.7 C) 98.3 F (36.8 C) (!) 97.3 F (36.3 C)  TempSrc: Oral Oral Oral Oral  SpO2: 99% 99% 99% 100%  Weight:      Height:        Intake/Output Summary (Last 24 hours) at 10/20/2017 1124 Last data filed at 10/20/2017 1045 Gross per 24 hour  Intake 218 ml  Output 550 ml  Net -332 ml   Filed Weights   10/17/17 1617  Weight: 59.5 kg   Exam  General: Well developed, well nourished, NAD, appears stated age  52: NCAT,mucous membranes moist.   Neck: Supple  Cardiovascular: S1 S2 auscultated, RRR. No murmur  Respiratory: Clear to auscultation bilaterally with equal chest rise  Abdomen: Soft, nontender, nondistended, + bowel sounds  Extremities: warm dry without cyanosis clubbing or edema  Neuro: AAOx3, nonfocal  Psych: Normal affect and demeanor, pleasant   Data Reviewed: I have personally reviewed following labs and imaging studies  CBC: Recent Labs  Lab 10/17/17 1827 10/18/17 0217 10/19/17 0248 10/19/17 0813 10/20/17 0303  WBC 15.3* 14.2* 10.6*  --  8.1  NEUTROABS 12.9* 12.2*  --   --   --   HGB 9.4* 9.0* 7.7* 7.6* 7.5*  HCT 26.6* 26.2* 22.1* 22.2* 22.1*  MCV 73.3* 73.6* 74.2*  --  74.7*  PLT 169 168 147*  --  314   Basic Metabolic Panel: Recent Labs  Lab 10/17/17 1827 10/18/17 0217 10/19/17 0248 10/20/17 0303  NA 138 139 135 136  K 3.7 3.4* 4.0 4.0  CL 107 108 110 112*  CO2 19* 18* 16* 15*  GLUCOSE 106* 108* 183* 91  BUN 49* 44* 44* 34*  CREATININE  3.75* 3.50* 3.52* 2.90*  CALCIUM 8.3* 8.4* 8.2* 8.0*   GFR: Estimated Creatinine Clearance: 12.6 mL/min (A) (by C-G formula based on SCr of 2.9 mg/dL (H)). Liver Function Tests: Recent Labs  Lab 10/17/17 1827 10/18/17 0217 10/19/17 0248 10/20/17 0303  AST 114* 102* 56* 28  ALT 155* 140* 99* 68*  ALKPHOS 156* 150* 123 107  BILITOT 5.5* 5.3* 2.2* 1.3*  PROT 7.1 6.8 6.0* 5.9*  ALBUMIN 3.4* 3.2* 2.7* 2.5*   Recent Labs  Lab 10/17/17 1827  LIPASE 109*   No results for input(s): AMMONIA in the last 168 hours. Coagulation Profile: Recent Labs  Lab 10/20/17 0303  INR 1.53   Cardiac Enzymes: No results for input(s): CKTOTAL, CKMB, CKMBINDEX, TROPONINI in the last 168 hours. BNP (last 3 results) No results for input(s): PROBNP in the last 8760 hours. HbA1C: Recent Labs    10/18/17 0217  HGBA1C 8.5*   CBG: Recent Labs  Lab 10/19/17 1223 10/19/17 1714 10/19/17 2201 10/19/17 2246 10/20/17 0804  GLUCAP 146* 254* 54* 103* 106*   Lipid Profile: No results for input(s): CHOL, HDL, LDLCALC, TRIG, CHOLHDL, LDLDIRECT in the last 72 hours. Thyroid Function Tests: Recent Labs    10/18/17 0217  TSH 0.339*  Anemia Panel: Recent Labs    10/18/17 0217  VITAMINB12 160*  FOLATE 16.1  RETICCTPCT 1.2   Urine analysis:    Component Value Date/Time   COLORURINE AMBER (A) 10/17/2017 2227   APPEARANCEUR CLEAR 10/17/2017 2227   LABSPEC 1.012 10/17/2017 2227   PHURINE 5.0 10/17/2017 2227   GLUCOSEU NEGATIVE 10/17/2017 2227   HGBUR SMALL (A) 10/17/2017 2227   BILIRUBINUR NEGATIVE 10/17/2017 2227   KETONESUR NEGATIVE 10/17/2017 2227   PROTEINUR 30 (A) 10/17/2017 2227   NITRITE NEGATIVE 10/17/2017 2227   LEUKOCYTESUR NEGATIVE 10/17/2017 2227   Sepsis Labs: @LABRCNTIP (procalcitonin:4,lacticidven:4)  )No results found for this or any previous visit (from the past 240 hour(s)).    Radiology Studies: Nm Hepatobiliary Liver Func  Result Date: 10/18/2017 CLINICAL DATA:   Right upper quadrant pain and vomiting for several days. EXAM: NUCLEAR MEDICINE HEPATOBILIARY IMAGING TECHNIQUE: Sequential images of the abdomen were obtained out to 60 minutes following intravenous administration of radiopharmaceutical. Because no gallbladder activity was seen, 2.4 mg morphine was administered intravenously, and imaging was continued for another 30 minutes. RADIOPHARMACEUTICALS:  8.1 mCi Tc-43m  Choletec IV COMPARISON:  None. FINDINGS: Prompt uptake and biliary excretion of activity by the liver is seen. Gallbladder activity is visualized following morphine administration, consistent with patency of cystic duct. Biliary activity passes into small bowel, consistent with patent common bile duct. IMPRESSION: Patency of cystic duct and common bile duct are demonstrated. Electronically Signed   By: Earle Gell M.D.   On: 10/18/2017 19:16   US Renal  Result Date: 10/19/2017 CLINICAL DATA:  Acute kidney injury EXAM: RENAL / URINARY TRACT ULTRASOUND COMPLETE COMPARISON:  CT 10/17/2017 FINDINGS: Right Kidney: Length: 10.5 cm. Cortical thinning and increased echotexture. No hydronephrosis. 1.4 cm lower pole cyst. Left Kidney: Length: 10.2 cm. Cortical thinning and increased echotexture. No hydronephrosis. Bladder: Appears normal for degree of bladder distention. IMPRESSION: Cortical thinning and mildly increased echotexture bilaterally compatible with chronic medical renal disease. No acute findings. Electronically Signed   By: Rolm Baptise M.D.   On: 10/19/2017 10:02     Scheduled Meds: . insulin aspart  0-15 Units Subcutaneous TID WC   Continuous Infusions: . piperacillin-tazobactam (ZOSYN)  IV 2.25 g (10/20/17 0652)     LOS: 3 days   Time Spent in minutes   30 minutes  Tanishi Nault D.O. on 10/20/2017 at 11:24 AM  Between 7am to 7pm - Please see pager noted on [email or web address]  After 7pm go to [email or web address]  And look for the night coverage person covering for me after hours  [facility]  [phone]

## 2017-10-20 NOTE — Progress Notes (Signed)
MD notified of H&H 7.5/22.1 via amion.  [email or web address]

## 2017-10-21 LAB — GLUCOSE, CAPILLARY
GLUCOSE-CAPILLARY: 144 mg/dL — AB (ref 70–99)
Glucose-Capillary: 97 mg/dL (ref 70–99)
Glucose-Capillary: 96 mg/dL (ref 70–99)
Glucose-Capillary: 125 mg/dL — ABNORMAL HIGH (ref 70–99)

## 2017-10-21 LAB — RENAL FUNCTION PANEL
Albumin: 2.6 g/dL — ABNORMAL LOW (ref 3.5–5.0)
Anion gap: 9 (ref 5–15)
BUN: 28 mg/dL — ABNORMAL HIGH (ref 8–23)
CALCIUM: 8.1 mg/dL — AB (ref 8.9–10.3)
CO2: 17 mmol/L — AB (ref 22–32)
Chloride: 112 mmol/L — ABNORMAL HIGH (ref 98–111)
Creatinine, Ser: 2.77 mg/dL — ABNORMAL HIGH (ref 0.44–1.00)
GFR calc non Af Amer: 14 mL/min — ABNORMAL LOW (ref >60)
GFR, EST AFRICAN AMERICAN: 17 mL/min — AB (ref >60)
GLUCOSE: 100 mg/dL — AB (ref 70–99)
PHOSPHORUS: 2.6 mg/dL (ref 2.5–4.6)
POTASSIUM: 3.6 mmol/L (ref 3.5–5.1)
SODIUM: 138 mmol/L (ref 135–145)

## 2017-10-21 LAB — HEMOGLOBIN AND HEMATOCRIT, BLOOD
HCT: 24 % — ABNORMAL LOW (ref 36.0–46.0)
HCT: 23.4 % — ABNORMAL LOW (ref 36.0–46.0)
HEMOGLOBIN: 8 g/dL — AB (ref 12.0–15.0)
Hemoglobin: 8.2 g/dL — ABNORMAL LOW (ref 12.0–15.0)

## 2017-10-21 NOTE — Progress Notes (Signed)
PROGRESS NOTE    [name]  NAT:[id number] DOB: 1927/06/09 DOA: 10/17/2017 PCP: [name], MD   Brief Narrative:  HPI on 10/17/2017 by Dr. Florencia [name] is a 84 y.o. female  With h/o colon cancer s/p right hemicolectomy 21yrs ago,  HTN, insulin dependent DM2, CKDIII, mild dementia on Namenda who has been independent presented to [facility] ED due to n/v, ab pain since Sunday, she reports not able to keep anything down. She denies fever. Reports stool have been dark for a few months.  CT AB  Showed marked distension of gallbladder with a possible 0.3cm stone at the ampullar of vater. cbd dialated at 1.1cm, Multiple calcifications throughout pancreatis duct and in the parenchyma of the pancrease. Labs, wbc 16, hgb 10.3, bun 55, cr 3.70, tbili 4.8. She received 2liter ns and zosyn, EDP contacted [facility] who accepted patient to transfer to [place]. hospitalist to admit, [facility] to consult.  Currently patient denies pain, no active n/v. No fever.  Interim history Admtted for abdominal pain due to GB disease. GI and general surgery consulted. HIDA scan obtained, negative for acalculous cholecystitis. Gen surg signed off.   Assessment & Plan   Abdominal pain secondary to related common bile duct without choledocholithiasis -?Acalculous Cholecystitis -Currently on Zosyn -MRCP: Diffuse gallbladder distention, gallbladder wall edema and mild pericholecystic fluid.  No gallstones identified.  Cannot exclude acute acalculous cholecystitis.  Increased caliber of the common bile duct without choledocholithiasis or obstructing mass. -Gastroenterology consulted and appreciated, general surgery consult -Patient did have elevated LFTs, improving and continuing to trend downward -Continue IV fluid and supportive care with pain management, antiemetics -HIDA: patency of cystic duct and CBD are demonstrated  -General surgery consulted and appreciated, given HIDA results, no surgical  intervention. Surg signed off -discussed with GI, [name], can advance to soft diet. Feels this may have been cholecystitis. No further work up or intervention needed at this time, given HIDA scan results and symptoms have improved.  -Diet advanced to soft, patient tolerated well  Acute kidney injury on chronic kidney disease, stage IV -Per review of care everywhere, it seems that patient's GFR has been in stage IV level since 2018 -Baseline creatinine is approximately 1.9-2 -Creatinine on admission was 3.75, mildly improved to 2.77 today -UA negative for infection -Continue to monitor BMP, avoid nephrotoxic agents -Discussed with [name], nephrology, no further intervention at this time -patient follows with a nephrologist in [place], [place]  Anemia, microcytic / Anemia of chronic disease -Reported having dark stools for several months -Hemoglobin currently 8.2, suspect drop in hemoglobin may be dilutional given that patient as receiving IVF -no evidence of bleeding  -Discontinued IVF and monitor CBC -Vitamin B12 level 160, folate level 18.1 -Given vitamin B12 supplementation  Diabetes mellitus, type II -Continue insulin sliding scale with CBG monitoring -hemoglobin A1c 8.5  Essential Hypertension -Home meds were held on admission -Continue bystolic and amlodipine today -continue to hold ramipril and HCTZ  Dementia/memory impairment -Continue Namenda  History of colon cancer -Need to have peritoneal nodules on MR, suggest repeating imaging in 3 to 6 months  Hypokalemia  -Resolved with replacement -continue to monitor BMP  DVT Prophylaxis  SCDs  Code Status: Full  Family Communication: none at bedside  Disposition Plan: Admitted. Pending SNF placement  Consultants Gastroenterology General surgery  Procedures  MRCP  Antibiotics   Anti-infectives (From admission, onward)   Start     Dose/Rate Route Frequency Ordered Stop   10/17/17  2100   piperacillin-tazobactam (ZOSYN) IVPB 2.25 g     2.25 g 100 mL/hr over 30 Minutes Intravenous Every 8 hours 10/17/17 2019        Subjective:   Danielle Bright seen and examined today. Patient denies further abdominal pain. Denies nausea, vomiting, diarrhea, constipation, chest pain, shortness of breath. States she feels weak.   Objective:   Vitals:   10/20/17 0454 10/20/17 1333 10/20/17 2147 10/21/17 0405  BP: (!) 160/69 (!) 150/68 137/74 (!) 159/69  Pulse: (!) 58 61 65 (!) 56  Resp: 17  17 16   Temp: (!) 97.3 F (36.3 C) 98.6 F (37 C) 98.5 F (36.9 C) 98 F (36.7 C)  TempSrc: Oral Oral Oral Oral  SpO2: 100% 99% 99% 99%  Weight:      Height:        Intake/Output Summary (Last 24 hours) at 10/21/2017 1134 Last data filed at 10/21/2017 1030 Gross per 24 hour  Intake 177 ml  Output 1001 ml  Net -824 ml   Filed Weights   10/17/17 1617  Weight: 59.5 kg   Exam  General: Well developed, well nourished, NAD, appears stated age  84: NCAT, mucous membranes moist.   Neck: Supple  Cardiovascular: S1 S2 auscultated, RRR, no murmur  Respiratory: Clear to auscultation bilaterally with equal chest rise  Abdomen: Soft, nontender, nondistended, + bowel sounds  Extremities: warm dry without cyanosis clubbing or edema  Neuro: AAOx3, nonfocal  Psych: Appropriate mood and affect, pleasant  Data Reviewed: I have personally reviewed following labs and imaging studies  CBC: Recent Labs  Lab 10/17/17 1827 10/18/17 0217 10/19/17 0248 10/19/17 0813 10/20/17 0303 10/21/17 0745  WBC 15.3* 14.2* 10.6*  --  8.1  --   NEUTROABS 12.9* 12.2*  --   --   --   --   HGB 9.4* 9.0* 7.7* 7.6* 7.5* 8.2*  HCT 26.6* 26.2* 22.1* 22.2* 22.1* 24.0*  MCV 73.3* 73.6* 74.2*  --  74.7*  --   PLT 169 168 147*  --  167  --    Basic Metabolic Panel: Recent Labs  Lab 10/17/17 1827 10/18/17 0217 10/19/17 0248 10/20/17 0303 10/21/17 0745  NA 138 139 135 136 138  K 3.7 3.4* 4.0 4.0 3.6  CL  107 108 110 112* 112*  CO2 19* 18* 16* 15* 17*  GLUCOSE 106* 108* 183* 91 100*  BUN 49* 44* 44* 34* 28*  CREATININE 3.75* 3.50* 3.52* 2.90* 2.77*  CALCIUM 8.3* 8.4* 8.2* 8.0* 8.1*  PHOS  --   --   --   --  2.6   GFR: Estimated Creatinine Clearance: 13.1 mL/min (A) (by C-G formula based on SCr of 2.77 mg/dL (H)). Liver Function Tests: Recent Labs  Lab 10/17/17 1827 10/18/17 0217 10/19/17 0248 10/20/17 0303 10/21/17 0745  AST 114* 102* 56* 28  --   ALT 155* 140* 99* 68*  --   ALKPHOS 156* 150* 123 107  --   BILITOT 5.5* 5.3* 2.2* 1.3*  --   PROT 7.1 6.8 6.0* 5.9*  --   ALBUMIN 3.4* 3.2* 2.7* 2.5* 2.6*   Recent Labs  Lab 10/17/17 1827  LIPASE 109*   No results for input(s): AMMONIA in the last 168 hours. Coagulation Profile: Recent Labs  Lab 10/20/17 0303  INR 1.53   Cardiac Enzymes: No results for input(s): CKTOTAL, CKMB, CKMBINDEX, TROPONINI in the last 168 hours. BNP (last 3 results) No results for input(s): PROBNP in the last 8760 hours. HbA1C: No  results for input(s): HGBA1C in the last 72 hours. CBG: Recent Labs  Lab 10/20/17 0804 10/20/17 1213 10/20/17 1724 10/20/17 2054 10/21/17 0802  GLUCAP 106* 164* 117* 183* 97   Lipid Profile: No results for input(s): CHOL, HDL, LDLCALC, TRIG, CHOLHDL, LDLDIRECT in the last 72 hours. Thyroid Function Tests: No results for input(s): TSH, T4TOTAL, FREET4, T3FREE, THYROIDAB in the last 72 hours. Anemia Panel: No results for input(s): VITAMINB12, FOLATE, FERRITIN, TIBC, IRON, RETICCTPCT in the last 72 hours. Urine analysis:    Component Value Date/Time   COLORURINE AMBER (A) 10/17/2017 2227   APPEARANCEUR CLEAR 10/17/2017 2227   LABSPEC 1.012 10/17/2017 2227   PHURINE 5.0 10/17/2017 2227   GLUCOSEU NEGATIVE 10/17/2017 2227   HGBUR SMALL (A) 10/17/2017 2227   BILIRUBINUR NEGATIVE 10/17/2017 [place] 10/17/2017 2227   PROTEINUR 30 (A) 10/17/2017 2227   NITRITE NEGATIVE 10/17/2017 2227    LEUKOCYTESUR NEGATIVE 10/17/2017 2227   Sepsis Labs: @LABRCNTIP (procalcitonin:4,lacticidven:4)  )No results found for this or any previous visit (from the past 240 hour(s)).    Radiology Studies: No results found.   Scheduled Meds: . amLODipine  5 mg Oral Daily  . insulin aspart  0-15 Units Subcutaneous TID WC  . nebivolol  20 mg Oral q morning - 10a   Continuous Infusions: . piperacillin-tazobactam (ZOSYN)  IV 2.25 g (10/21/17 0624)     LOS: 4 days   Time Spent in minutes   30 minutes  Glorimar Stroope D.O. on 10/21/2017 at 11:34 AM  Between 7am to 7pm - Please see pager noted on [email or web address]  After 7pm go to [email or web address]  And look for the night coverage person covering for me after hours  [facility] Office  [phone]

## 2017-10-22 DIAGNOSIS — R945 Abnormal results of liver function studies: Secondary | ICD-10-CM

## 2017-10-22 DIAGNOSIS — R1011 Right upper quadrant pain: Secondary | ICD-10-CM

## 2017-10-22 LAB — BASIC METABOLIC PANEL
ANION GAP: 7 (ref 5–15)
BUN: 28 mg/dL — ABNORMAL HIGH (ref 8–23)
CHLORIDE: 112 mmol/L — AB (ref 98–111)
CO2: 17 mmol/L — AB (ref 22–32)
Calcium: 7.9 mg/dL — ABNORMAL LOW (ref 8.9–10.3)
Creatinine, Ser: 2.69 mg/dL — ABNORMAL HIGH (ref 0.44–1.00)
GFR calc Af Amer: 17 mL/min — ABNORMAL LOW (ref >60)
GFR calc non Af Amer: 15 mL/min — ABNORMAL LOW (ref >60)
GLUCOSE: 93 mg/dL (ref 70–99)
POTASSIUM: 3.5 mmol/L (ref 3.5–5.1)
Sodium: 136 mmol/L (ref 135–145)

## 2017-10-22 LAB — HEMOGLOBIN AND HEMATOCRIT, BLOOD
HEMATOCRIT: 25.7 % — AB (ref 36.0–46.0)
HEMOGLOBIN: 8.5 g/dL — AB (ref 12.0–15.0)

## 2017-10-22 LAB — GLUCOSE, CAPILLARY
GLUCOSE-CAPILLARY: 139 mg/dL — AB (ref 70–99)
Glucose-Capillary: 86 mg/dL (ref 70–99)
Glucose-Capillary: 86 mg/dL (ref 70–99)
Glucose-Capillary: 138 mg/dL — ABNORMAL HIGH (ref 70–99)

## 2017-10-22 NOTE — Progress Notes (Signed)
PROGRESS NOTE    Danielle Bright  AOZ:[id number] DOB: 07/22/1927 DOA: 10/17/2017 PCP: [name], MD   Brief Narrative:  HPI on 10/17/2017 by Dr. Florencia [name] is a 84 y.o. female  With h/o colon cancer s/p right hemicolectomy 67yrs ago,  HTN, insulin dependent DM2, CKDIII, mild dementia on Namenda who has been independent presented to [facility] ED due to n/v, ab pain since Sunday, she reports not able to keep anything down. She denies fever. Reports stool have been dark for a few months.  CT AB  Showed marked distension of gallbladder with a possible 0.3cm stone at the ampullar of vater. cbd dialated at 1.1cm, Multiple calcifications throughout pancreatis duct and in the parenchyma of the pancrease. Labs, wbc 16, hgb 10.3, bun 55, cr 3.70, tbili 4.8. She received 2liter ns and zosyn, EDP contacted [facility] who accepted patient to transfer to [place]. hospitalist to admit, [facility] to consult.  Currently patient denies pain, no active n/v. No fever.  Interim history Admtted for abdominal pain due to GB disease. GI and general surgery consulted. HIDA scan obtained, negative for acalculous cholecystitis. Gen surg signed off. Pending SNF  Assessment & Plan   Abdominal pain secondary to related common bile duct without choledocholithiasis -?Acalculous Cholecystitis -Currently on Zosyn- will discontinue today -MRCP: Diffuse gallbladder distention, gallbladder wall edema and mild pericholecystic fluid.  No gallstones identified.  Cannot exclude acute acalculous cholecystitis.  Increased caliber of the common bile duct without choledocholithiasis or obstructing mass. -Gastroenterology consulted and appreciated, general surgery consult -Patient did have elevated LFTs, improving and continuing to trend downward -Continue IV fluid and supportive care with pain management, antiemetics -HIDA: patency of cystic duct and CBD are demonstrated  -General surgery consulted and appreciated, given  HIDA results, no surgical intervention. Surg signed off -discussed with GI, [name], can advance to soft diet. Feels this may have been cholecystitis. No further work up or intervention needed at this time, given HIDA scan results and symptoms have improved.  -Diet advanced to soft, patient tolerated well  Acute kidney injury on chronic kidney disease, stage IV -Per review of care everywhere, it seems that patient's GFR has been in stage IV level since 2018 -Baseline creatinine is approximately 1.9-2 -Creatinine on admission was 3.75, mildly improved to 2.69 today -UA negative for infection -Continue to monitor BMP, avoid nephrotoxic agents -Discussed with [name], nephrology, no further intervention at this time -patient follows with a nephrologist in [place], [place]  Anemia, microcytic / Anemia of chronic disease -Reported having dark stools for several months -Hemoglobin currently 8.5, suspect drop in hemoglobin may be dilutional given that patient as receiving IVF -no evidence of bleeding  -Discontinued IVF and monitor CBC -Vitamin B12 level 160, folate level 18.1 -Given vitamin B12 supplementation  Diabetes mellitus, type II -Continue insulin sliding scale with CBG monitoring -hemoglobin A1c 8.5  Essential Hypertension -Home meds were held on admission -Continue bystolic and amlodipine today -continue to hold ramipril and HCTZ  Dementia/memory impairment -Continue Namenda  History of colon cancer -Need to have peritoneal nodules on MR, suggest repeating imaging in 3 to 6 months  Hypokalemia  -Resolved with replacement -continue to monitor BMP  DVT Prophylaxis  SCDs  Code Status: Full  Family Communication: none at bedside  Disposition Plan: Admitted. Pending SNF placement  Consultants Gastroenterology General surgery  Procedures  MRCP  Antibiotics   Anti-infectives (From admission, onward)   Start     Dose/Rate Route Frequency Ordered  Stop    10/17/17 2100  piperacillin-tazobactam (ZOSYN) IVPB 2.25 g     2.25 g 100 mL/hr over 30 Minutes Intravenous Every 8 hours 10/17/17 2019        Subjective:   Danielle Bright seen and examined today. Denies abdominal pain, nausea, vomiting.  Able to tolerate her diet.  Denies current chest pain, shortness of breath, diarrhea constipation, dizziness or headache.  Objective:   Vitals:   10/21/17 1415 10/21/17 2101 10/22/17 0454 10/22/17 1000  BP: (!) 149/85 (!) 133/59 (!) 154/67 (!) 151/68  Pulse: (!) 54 (!) 58 (!) 56 62  Resp:  (!) 22 12 16   Temp: 98.7 F (37.1 C) 98.4 F (36.9 C) 98.5 F (36.9 C) 97.7 F (36.5 C)  TempSrc: Oral Oral Oral Oral  SpO2: 99% 100% 98% 100%  Weight:      Height:        Intake/Output Summary (Last 24 hours) at 10/22/2017 1115 Last data filed at 10/21/2017 1826 Gross per 24 hour  Intake -  Output 250 ml  Net -250 ml   Filed Weights   10/17/17 1617  Weight: 59.5 kg   Exam  General: Well developed, well nourished, NAD, appears stated age  85: NCAT, mucous membranes moist.   Neck: Supple  Cardiovascular: S1 S2 auscultated, RRR  Respiratory: Clear to auscultation bilaterally with equal chest rise  Abdomen: Soft, nontender, nondistended, + bowel sounds  Extremities: warm dry without cyanosis clubbing or edema  Neuro: AAOx3, nonfocal  Psych: Pleasant, appropriate mood and affect  Data Reviewed: I have personally reviewed following labs and imaging studies  CBC: Recent Labs  Lab 10/17/17 1827 10/18/17 0217 10/19/17 0248 10/19/17 0813 10/20/17 0303 10/21/17 0745 10/21/17 1220 10/22/17 0917  WBC 15.3* 14.2* 10.6*  --  8.1  --   --   --   NEUTROABS 12.9* 12.2*  --   --   --   --   --   --   HGB 9.4* 9.0* 7.7* 7.6* 7.5* 8.2* 8.0* 8.5*  HCT 26.6* 26.2* 22.1* 22.2* 22.1* 24.0* 23.4* 25.7*  MCV 73.3* 73.6* 74.2*  --  74.7*  --   --   --   PLT 169 168 147*  --  167  --   --   --    Basic Metabolic Panel: Recent Labs  Lab  10/18/17 0217 10/19/17 0248 10/20/17 0303 10/21/17 0745 10/22/17 0400  NA 139 135 136 138 136  K 3.4* 4.0 4.0 3.6 3.5  CL 108 110 112* 112* 112*  CO2 18* 16* 15* 17* 17*  GLUCOSE 108* 183* 91 100* 93  BUN 44* 44* 34* 28* 28*  CREATININE 3.50* 3.52* 2.90* 2.77* 2.69*  CALCIUM 8.4* 8.2* 8.0* 8.1* 7.9*  PHOS  --   --   --  2.6  --    GFR: Estimated Creatinine Clearance: 13.5 mL/min (A) (by C-G formula based on SCr of 2.69 mg/dL (H)). Liver Function Tests: Recent Labs  Lab 10/17/17 1827 10/18/17 0217 10/19/17 0248 10/20/17 0303 10/21/17 0745  AST 114* 102* 56* 28  --   ALT 155* 140* 99* 68*  --   ALKPHOS 156* 150* 123 107  --   BILITOT 5.5* 5.3* 2.2* 1.3*  --   PROT 7.1 6.8 6.0* 5.9*  --   ALBUMIN 3.4* 3.2* 2.7* 2.5* 2.6*   Recent Labs  Lab 10/17/17 1827  LIPASE 109*   No results for input(s): AMMONIA in the last 168 hours. Coagulation Profile: Recent Labs  Lab 10/20/17 0303  INR 1.53   Cardiac Enzymes: No results for input(s): CKTOTAL, CKMB, CKMBINDEX, TROPONINI in the last 168 hours. BNP (last 3 results) No results for input(s): PROBNP in the last 8760 hours. HbA1C: No results for input(s): HGBA1C in the last 72 hours. CBG: Recent Labs  Lab 10/21/17 1201 10/21/17 1647 10/21/17 2104 10/22/17 0637 10/22/17 0750  GLUCAP 144* 96 125* 86 86   Lipid Profile: No results for input(s): CHOL, HDL, LDLCALC, TRIG, CHOLHDL, LDLDIRECT in the last 72 hours. Thyroid Function Tests: No results for input(s): TSH, T4TOTAL, FREET4, T3FREE, THYROIDAB in the last 72 hours. Anemia Panel: No results for input(s): VITAMINB12, FOLATE, FERRITIN, TIBC, IRON, RETICCTPCT in the last 72 hours. Urine analysis:    Component Value Date/Time   COLORURINE AMBER (A) 10/17/2017 2227   APPEARANCEUR CLEAR 10/17/2017 2227   LABSPEC 1.012 10/17/2017 2227   PHURINE 5.0 10/17/2017 2227   GLUCOSEU NEGATIVE 10/17/2017 2227   HGBUR SMALL (A) 10/17/2017 2227   BILIRUBINUR NEGATIVE  10/17/2017 [place] 10/17/2017 2227   PROTEINUR 30 (A) 10/17/2017 2227   NITRITE NEGATIVE 10/17/2017 2227   LEUKOCYTESUR NEGATIVE 10/17/2017 2227   Sepsis Labs: @LABRCNTIP (procalcitonin:4,lacticidven:4)  )No results found for this or any previous visit (from the past 240 hour(s)).    Radiology Studies: No results found.   Scheduled Meds: . amLODipine  5 mg Oral Daily  . insulin aspart  0-15 Units Subcutaneous TID WC  . nebivolol  20 mg Oral q morning - 10a   Continuous Infusions: . piperacillin-tazobactam (ZOSYN)  IV 2.25 g (10/22/17 0623)     LOS: 5 days   Time Spent in minutes   30 minutes  Marcellina Jonsson D.O. on 10/22/2017 at 11:15 AM  Between 7am to 7pm - Please see pager noted on [email or web address]  After 7pm go to [email or web address]  And look for the night coverage person covering for me after hours  [facility] Office  [phone]

## 2017-10-22 NOTE — Progress Notes (Signed)
Occupational Therapy Evaluation Patient Details Name: Danielle Bright MRN: [id number] DOB: 19-Sep-1927 Today's Date: 10/22/2017    History of Present Illness Patient presented to the hospoutal with abdom,inal pain. Her pain has improved since she has been at the hospital but she is feeling weak compared to how she was feeling before she started having pain. PMH DMII, cancer, arthritis    Clinical Impression   PTA, pt lived alone and had a PCA 3 days/wk to assist with ADL and IADL tasks. Pt presents with generalized weakness and is at increased risk for falls. Feel pt could benefit from short term rehab at SNF to facilitate safe DC home with intermittent S (return to PLOF). Will follow acutely.  Encourage ambulation with staff.    Follow Up Recommendations  SNF;Supervision/Assistance - 24 hour    Equipment Recommendations  None recommended by OT    Recommendations for Other Services       Precautions / Restrictions Precautions Precautions: Fall      Mobility Bed Mobility               General bed mobility comments: OOB in chair  Transfers Overall transfer level: Needs assistance   Transfers: Sit to/from Stand Sit to Stand: Min assist         General transfer comment: increaed effort; slow    Balance Overall balance assessment: Needs assistance   Sitting balance-Leahy Scale: Good       Standing balance-Leahy Scale: Fair                             ADL either performed or assessed with clinical judgement   ADL Overall ADL's : Needs assistance/impaired Eating/Feeding: Independent   Grooming: Set up;Standing   Upper Body Bathing: Set up;Standing   Lower Body Bathing: Min guard;Sit to/from stand   Upper Body Dressing : Set up;Sitting   Lower Body Dressing: Min guard;Sit to/from stand   Toilet Transfer: Min guard;Ambulation;RW   Toileting- Clothing Manipulation and Hygiene: Supervision/safety;Set up       Functional mobility during  ADLs: Min guard;Rolling walker;Cueing for safety General ADL Comments: Fatigues easily during ADL tasks; increased S for dynamic standing tasks     Vision         Perception     Praxis      Pertinent Vitals/Pain Pain Assessment: No/denies pain     Hand Dominance Right   Extremity/Trunk Assessment Upper Extremity Assessment Upper Extremity Assessment: generalized weakness   Lower Extremity Assessment Lower Extremity Assessment: Defer to PT evaluation; generalized weakness   Cervical / Trunk Assessment Cervical / Trunk Assessment: Normal   Communication Communication Communication: No difficulties   Cognition Arousal/Alertness: Awake/alert Behavior During Therapy: WFL for tasks assessed/performed Overall Cognitive Status: History of cognitive impairments - at baseline; mild dementia per chair; note decreased STM during ADL task                                 General Comments: Decreased STM   General Comments       Exercises     Shoulder Instructions      Home Living Family/patient expects to be discharged to:: Skilled nursing facility  Prior Functioning/Environment Level of Independence: Independent;Independent with assistive device(s)        Comments: Has PCA 3 days/wk to assist with bathing and IADL tasks        OT Problem List: Decreased strength;Decreased activity tolerance;Impaired balance (sitting and/or standing);Decreased safety awareness;Decreased knowledge of use of DME or AE      OT Treatment/Interventions: Self-care/ADL training;Therapeutic exercise;Energy conservation;DME and/or AE instruction;Therapeutic activities;Cognitive remediation/compensation;Patient/family education;Balance training    OT Goals(Current goals can be found in the care plan section) Acute Rehab OT Goals Patient Stated Goal: to get stronger OT Goal Formulation: With patient/family Time For Goal  Achievement: 11/05/17 Potential to Achieve Goals: Good  OT Frequency: Min 2X/week   Barriers to D/C:            Co-evaluation              AM-PAC PT "6 Clicks" Daily Activity     Outcome Measure Help from another person eating meals?: None Help from another person taking care of personal grooming?: None Help from another person toileting, which includes using toliet, bedpan, or urinal?: A Little Help from another person bathing (including washing, rinsing, drying)?: A Little Help from another person to put on and taking off regular upper body clothing?: None Help from another person to put on and taking off regular lower body clothing?: A Little 6 Click Score: 21   End of Session Equipment Utilized During Treatment: Gait belt;Rolling walker Nurse Communication: Mobility status  Activity Tolerance: Patient tolerated treatment well Patient left: in chair;with call bell/phone within reach;with nursing/sitter in room  OT Visit Diagnosis: Unsteadiness on feet (R26.81);Muscle weakness (generalized) (M62.81);Other symptoms and signs involving cognitive function                Time: 1032-1057 OT Time Calculation (min): 25 min Charges:  OT General Charges $OT Visit: 1 Visit OT Evaluation $OT Eval Moderate Complexity: 1 Mod OT Treatments $Self Care/Home Management : 8-22 mins  Maurie Boettcher, OT/L   Acute OT Clinical Specialist Acute Rehabilitation Services Pager [phone] Office [phone]   [facility] 10/22/2017, 12:20 PM

## 2017-10-22 NOTE — Progress Notes (Signed)
Physical Therapy Treatment Patient Details Name: Danielle Bright MRN: [id number] DOB: [birth year] Today's Date: 10/22/2017    History of Present Illness Patient presented to the hospoutal with abdom,inal pain. Her pain has improved since she has been at the hospital but she is feeling weak compared to how she was feeling before she started having pain. PMH DMII, cancer, arthritis     PT Comments    Patient seen for mobility progression. Pt tolerated session well and is making progress toward PT goals. Pt does continue to demonstrate generalized weakness and now needing RW for safe ambulation. Continue to progress as tolerated with anticipated d/c to SNF for further skilled PT services.     Follow Up Recommendations  SNF     Equipment Recommendations  Rolling walker with 5" wheels    Recommendations for Other Services       Precautions / Restrictions Precautions Precautions: Fall    Mobility  Bed Mobility Overal bed mobility: Needs Assistance Bed Mobility: Supine to Sit;Sit to Supine     Supine to sit: Supervision Sit to supine: Supervision   General bed mobility comments: for safety  Transfers Overall transfer level: Needs assistance Equipment used: Rolling walker (2 wheeled) Transfers: Sit to/from Stand Sit to Stand: Min assist         General transfer comment: increased time and effort; pt unable to stand initially from EOB first trial but second trial much better with assist   Ambulation/Gait Ambulation/Gait assistance: Min guard Gait Distance (Feet): (hallway ambulation) Assistive device: Rolling walker (2 wheeled) Gait Pattern/deviations: Step-through pattern;Decreased stride length;Trunk flexed Gait velocity: decreased   General Gait Details: cues for posture, increased stride length and cadence; slow, steady gait    Stairs             Wheelchair Mobility    Modified Rankin (Stroke Patients Only)       Balance Overall balance assessment:  Needs assistance   Sitting balance-Leahy Scale: Good       Standing balance-Leahy Scale: Fair                              Cognition Arousal/Alertness: Awake/alert Behavior During Therapy: WFL for tasks assessed/performed Overall Cognitive Status: History of cognitive impairments - at baseline                                 General Comments: Decreased STM      Exercises General Exercises - Lower Extremity Ankle Circles/Pumps: AROM;Both Quad Sets: AROM;Both Long Arc Quad: AROM;Both;Seated Hip ABduction/ADduction: AROM;Both Hip Flexion/Marching: AROM;Both;Seated    General Comments        Pertinent Vitals/Pain Pain Assessment: No/denies pain    Home Living                      Prior Function            PT Goals (current goals can now be found in the care plan section) Acute Rehab PT Goals Patient Stated Goal: to get stronger Progress towards PT goals: Progressing toward goals    Frequency    Min 3X/week      PT Plan Current plan remains appropriate    Co-evaluation              AM-PAC PT "6 Clicks" Daily Activity  Outcome Measure  Difficulty turning over in bed (including adjusting  bedclothes, sheets and blankets)?: A Little Difficulty moving from lying on back to sitting on the side of the bed? : A Little Difficulty sitting down on and standing up from a chair with arms (e.g., wheelchair, bedside commode, etc,.)?: Unable Help needed moving to and from a bed to chair (including a wheelchair)?: A Little Help needed walking in hospital room?: A Little Help needed climbing 3-5 steps with a railing? : A Lot 6 Click Score: 15    End of Session Equipment Utilized During Treatment: Gait belt Activity Tolerance: Patient tolerated treatment well Patient left: with call bell/phone within reach;in bed;with bed alarm set Nurse Communication: Mobility status PT Visit Diagnosis: Unsteadiness on feet (R26.81);Muscle  weakness (generalized) (M62.81)     Time: 7225-7505 PT Time Calculation (min) (ACUTE ONLY): 17 min  Charges:  $Gait Training: 8-22 mins                     Danielle Bright, PTA Acute Rehabilitation Services Pager: [phone] Office: [phone]     Darliss Cheney 10/22/2017, 4:46 PM

## 2017-10-22 NOTE — [place] FL2 (Signed)
[place] MEDICAID FL2 LEVEL OF CARE SCREENING TOOL     IDENTIFICATION  Patient Name: Danielle Bright Birthdate: 27-Dec-1927 Sex: female Admission Date (Current Location): 10/17/2017  [facility] and [place] Number:  [place](Patient hospitalized in [place] and lives in [place])   Facility and Address:  [facility] [place] [address] [place], [place] [address]      Provider Number: [id number]  Attending Physician Name and Address:  Cristal Ford, DO  Relative Name and Phone Number:  Fleeta Emmer - [phone]    Current Level of Care: Hospital Recommended Level of Care: [place] Prior Approval Number:    Date Approved/Denied:   PASRR Number: [id number](Eff. 10/22/17)  Discharge Plan: SNF    Current Diagnoses: Patient Active Problem List   Diagnosis Date Noted  . Abnormal LFTs (liver function tests)   . RUQ pain   . Cholecystitis 10/17/2017  . Hx of colon cancer, stage II 07/18/2011    Orientation RESPIRATION BLADDER Height & Weight     Self, Time, Situation, Place  Normal Continent Weight: 131 lb 2.8 oz (59.5 kg) Height:  5\' 6"  (167.6 cm)  BEHAVIORAL SYMPTOMS/MOOD NEUROLOGICAL BOWEL NUTRITION STATUS      Continent Diet(Soft diet)  AMBULATORY STATUS COMMUNICATION OF NEEDS Skin   Limited Assist(Min Guard) Verbally Normal                       Personal Care Assistance Level of Assistance  Bathing, Feeding, Dressing, Total care Bathing Assistance: Limited assistance(Upper body assistance with set-up and Lower body min. assist) Feeding assistance: Independent Dressing Assistance: Limited assistance(Upper body assistance with set-up and Lower body min assist)     Functional Limitations Info  Sight, Hearing, Speech Sight Info: Impaired Hearing Info: Adequate Speech Info: Adequate    SPECIAL CARE FACTORS FREQUENCY  PT (By licensed PT), OT (By licensed OT)     PT Frequency: Evaluated 10/19 and 3  times per week therapy recommended during acute inpatient stay OT Frequency: Evaluated 10/21 and 2X per week therapy recommended during acute inpatient stay            Contractures Contractures Info: Not present    Additional Factors Info  Code Status, Allergies, Insulin Sliding Scale Code Status Info: Full Allergies Info: No known allergies   Insulin Sliding Scale Info: 0-15 Units 3 times a day with meals       Current Medications (10/22/2017):  This is the current hospital active medication list Current Facility-Administered Medications  Medication Dose Route Frequency Provider Last Rate Last Dose  . amLODipine (NORVASC) tablet 5 mg  5 mg Oral Daily Cristal Ford, DO   5 mg at 10/22/17 1008  . clonazePAM (KLONOPIN) disintegrating tablet 0.5 mg  0.5 mg Oral BID PRN Florencia Reasons, MD      . hydrALAZINE (APRESOLINE) injection 5 mg  5 mg Intravenous Q6H PRN Florencia Reasons, MD      . insulin aspart (novoLOG) injection 0-15 Units  0-15 Units Subcutaneous TID WC Florencia Reasons, MD   2 Units at 10/21/17 1318  . morphine 2 MG/ML injection 1 mg  1 mg Intravenous Q4H PRN Florencia Reasons, MD   1 mg at 10/18/17 0528  . nebivolol (BYSTOLIC) tablet 20 mg  20 mg Oral q morning - 10a Mikhail, Velta Addison, DO   20 mg at 10/22/17 1008  . ondansetron (ZOFRAN) injection 4 mg  4 mg Intravenous Q6H PRN Florencia Reasons, MD      .  piperacillin-tazobactam (ZOSYN) IVPB 2.25 g  2.25 g Intravenous Blair Promise, MD 100 mL/hr at 10/22/17 1352 2.25 g at 10/22/17 1352     Discharge Medications: Please see discharge summary for a list of discharge medications.  Relevant Imaging Results:  Relevant Lab Results:   Additional Information ss# [id number]  Sable Feil, LCSW

## 2017-10-22 NOTE — Clinical Social Work Note (Signed)
Clinical Social Work Assessment  Patient Details  Name: Danielle Bright MRN: [id number] Date of Birth: August 29, 1927  Date of referral:  10/22/17               Reason for consult:  Facility Placement, Discharge Planning                Permission sought to share information with:  Family Supports Permission granted to share information::  Yes, Verbal Permission Granted  Name::     Fleeta Emmer (lives in [place]) and Lovey Newcomer (lives in [place])  Agency::     Relationship::  Daughters  Contact Information:  Jolyn Lent [phone] and Bethena Roys [phone]  Housing/Transportation Living arrangements for the past 2 months:  [place] of Information:  Patient, Adult Children Patient Interpreter Needed:  None Criminal Activity/Legal Involvement Pertinent to Current Situation/Hospitalization:  No - Comment as needed Significant Relationships:  Adult Children Lives with:  Self Do you feel safe going back to the place where you live?  No(Patient and daughters in agreement with ST rehab prior to patient returning home) Need for family participation in patient care:  Yes (Comment)  Care giving concerns:  Patient expressed that short-term rehab the best d/c plan as she lives alone and daughters also in agreement.  Social Worker assessment / plan:  CSW talked with patient and daughters at the bedside regarding [name]'s d/c disposition. When asked patient reported that she has never been to rehab before in a skilled facility, however she has had [facility] rehab services at home. Patient expressed agreement with ST rehab as did her daughters and CSW was informed that their preference is [facility] as they have received very favorable information regarding this facility.  **CSW also advised by daughter Jolyn Lent that she is POA for patient.   Call made to hospital liaison Juliann Pulse later regarding patient's choice of their facility and that patient ready for discharge on Tuesday.  Employment status:   Retired Forensic scientist:  Information systems manager, Programmer, applications PT Recommendations:  [place] / Referral to community resources:  [facility](Daughters provided with SNF list, but expressed their prefereence of [facility])  Patient/Family's Response to care:  [name] and her daughters expressed no concerns regarding patient's care during hospitalization.  Patient/Family's Understanding of and Emotional Response to Diagnosis, Current Treatment, and Prognosis:  [name] and her daughters expressed understanding of her need for rehab and had talked with family and [facility] suggested.  Emotional Assessment Appearance:  Appears younger than stated age Attitude/Demeanor/Rapport:  Engaged Affect (typically observed):  Appropriate, Pleasant Orientation:  Oriented to Self, Oriented to Place, Oriented to  Time, Oriented to Situation Alcohol / Substance use:  Never Used Psych involvement (Current and /or in the community):  No (Comment)  Discharge Needs  Concerns to be addressed:  Discharge Planning Concerns Readmission within the last 30 days:  No Current discharge risk:  None Barriers to Discharge:  Other(Patient ready for discharge and can d/c to [facility] on Tuesday, 10/22)   Sharlet Salina, Mila Homer, LCSW 10/22/2017, 6:06 PM

## 2017-10-23 DIAGNOSIS — N184 Chronic kidney disease, stage 4 (severe): Secondary | ICD-10-CM

## 2017-10-23 LAB — GLUCOSE, CAPILLARY
GLUCOSE-CAPILLARY: 96 mg/dL (ref 70–99)
Glucose-Capillary: 90 mg/dL (ref 70–99)

## 2017-10-23 NOTE — Social Work (Signed)
CSW acknowledging hand off from [place] stating pt ready for discharge. Will d/c pt once summary and any needed prescriptions for controlled substances printed and signed.  Westley Hummer, MSW, [place] [facility] [phone]

## 2017-10-23 NOTE — Social Work (Addendum)
Clinical Social Worker facilitated patient discharge including contacting patient family and facility to confirm patient discharge plans.  Clinical information faxed to facility and family agreeable with plan.  CSW aware pt will be transported to [facility] by personal car. Please hold pt until room number available.  RN to call [phone] with report  prior to discharge.  Clinical Social Worker will sign off for now as social work intervention is no longer needed. Please consult [place] again if new need arises.  Alexander Mt, [place] Social Worker [phone]

## 2017-10-23 NOTE — Discharge Summary (Signed)
Physician Discharge Summary  Danielle Bright WCH:[id number] DOB: [birth year] DOA: 10/17/2017  PCP: [name], MD  Admit date: 10/17/2017 Discharge date: 10/23/2017  Time spent: 45 minutes  Recommendations for Outpatient Follow-up:  Patient will be discharged to skilled nursing facility. Continue physical and occupational therapy.  Patient will need to follow up with primary care provider within one week of discharge, repeat CBC and BMP.  Follow up with nephrology. Patient should continue medications as prescribed.  Patient should follow a soft diet.   Discharge Diagnoses:  Abdominal pain secondary to related common bile duct without choledocholithiasis Acute kidney injury on chronic kidney disease, stage IV Anemia, microcytic / Anemia of chronic disease Diabetes mellitus, type II Essential Hypertension Dementia/memory impairment History of colon cancer Hypokalemia   Discharge Condition: Stable   Diet recommendation: soft   Filed Weights   10/17/17 1617  Weight: 59.5 kg    History of present illness:  on 10/17/2017 by Dr. [name] a 84 y.o.female With h/o colon cancer s/p right hemicolectomy 96yrs ago, HTN, insulin dependent DM2, CKDIII, mild dementia on Namenda who has been independent presented to [facility] ED due to n/v, ab pain since Sunday, she reports not able to keep anything down. She denies fever. Reports stool have been dark for a few months.  CT AB Showed marked distension of gallbladder with a possible 0.3cm stone at the ampullar of vater. cbd dialated at 1.1cm,Multiple calcifications throughout pancreatis duct and in the parenchyma of the pancrease. Labs, wbc 16, hgb 10.3, bun 55, cr 3.70, tbili 4.8. She received 2liter ns and zosyn, EDP contacted [facility] who accepted patient to transfer to [facility] hospitalist to admit, [facility] to consult. Currently patient denies pain, no active n/v. No fever.  Hospital Course:  Abdominal pain secondary  to related common bile duct without choledocholithiasis -?Acalculous Cholecystitis -Currently on Zosyn- will discontinue today -MRCP: Diffuse gallbladder distention, gallbladder wall edema and mild pericholecystic fluid.  No gallstones identified.  Cannot exclude acute acalculous cholecystitis.  Increased caliber of the common bile duct without choledocholithiasis or obstructing mass. -Gastroenterology consulted and appreciated, general surgery consult -Patient did have elevated LFTs, improving and continuing to trend downward -Continue IV fluid and supportive care with pain management, antiemetics -HIDA: patency of cystic duct and CBD are demonstrated  -General surgery consulted and appreciated, given HIDA results, no surgical intervention. Surg signed off -discussed with GI, [name], can advance to soft diet. Feels this may have been cholecystitis. No further work up or intervention needed at this time, given HIDA scan results and symptoms have improved.  -Diet advanced to soft, patient tolerated well  Acute kidney injury on chronic kidney disease, stage IV -Per review of care everywhere, it seems that patient's GFR has been in stage IV level since 2018 -Baseline creatinine is approximately 1.9-2 -Creatinine on admission was 3.75, mildly improved to 2.69 today -UA negative for infection -Discussed with [name], nephrology, no further intervention at this time -patient follows with a nephrologist in [place], [place]  Anemia, microcytic / Anemia of chronic disease -Reported having dark stools for several months -Hemoglobin currently 8.5, suspect drop in hemoglobin may be dilutional given that patient as receiving IVF -no evidence of bleeding  -Discontinued IVF and monitor CBC -Vitamin B12 level 160, folate level 18.1 -Given vitamin B12 supplementation -Continue CBC in one week  Diabetes mellitus, type II -Continue insulin sliding scale with CBG monitoring -hemoglobin A1c  8.5  Essential Hypertension -Home meds were held on  admission -Continue bystolic and amlodipine -continue to hold ramipril and HCTZ  Dementia/memory impairment -Continue Namenda  History of colon cancer -Need to have peritoneal nodules on MR, suggest repeating imaging in 3 to 6 months  Hypokalemia  -Resolved with replacement -Repeat BMP in one week  Consultants Gastroenterology General surgery  Procedures  MRCP  Discharge Exam: Vitals:   10/22/17 2157 10/23/17 0509  BP: (!) 150/74 133/77  Pulse: (!) 59 (!) 59  Resp: 14 16  Temp: 98.3 F (36.8 C) 98.1 F (36.7 C)  SpO2: 99% 99%   Patient has no further complaints of abdominal pain, nausea, vomiting. Denies chest pain, shortness of breath, dizziness, headache.    General: Well developed, well nourished, NAD, appears stated age  84: NCAT, mucous membranes moist.  Neck: Supple  Cardiovascular: S1 S2 auscultated, RRR  Respiratory: Clear to auscultation bilaterally with equal chest rise  Abdomen: Soft, nontender, nondistended, + bowel sounds  Extremities: warm dry without cyanosis clubbing or edema  Neuro: AAOx3, nonfocal  Psych: Pleasant, appropriate mood and affect   Discharge Instructions Discharge Instructions    Discharge instructions   Complete by:  As directed    Patient will be discharged to skilled nursing facility. Continue physical and occupational therapy.  Patient will need to follow up with primary care provider within one week of discharge, repeat CBC and BMP and discuss blood pressure management.  Follow up with nephrology. Patient should continue medications as prescribed.  Patient should follow a soft diet.     Allergies as of 10/23/2017   No Known Allergies     Medication List    STOP taking these medications   hydrochlorothiazide 25 MG tablet Commonly known as:  HYDRODIURIL   HYDROcodone-acetaminophen 5-325 MG tablet Commonly known as:  NORCO/VICODIN   ramipril 10 MG  capsule Commonly known as:  ALTACE     TAKE these medications   acetaminophen 325 MG tablet Commonly known as:  TYLENOL Take 650 mg by mouth every 6 (six) hours as needed for mild pain.   amLODipine 5 MG tablet Commonly known as:  NORVASC Take 5 mg by mouth daily.   aspirin EC 81 MG tablet Take 81 mg by mouth daily.   BYSTOLIC 20 MG Tabs Generic drug:  Nebivolol HCl 20 mg every morning.   clonazePAM 0.5 MG disintegrating tablet Commonly known as:  KLONOPIN Take 0.5 mg by mouth 2 (two) times daily as needed.   gabapentin 100 MG capsule Commonly known as:  NEURONTIN Take 100 mg by mouth 3 (three) times daily.   LEVEMIR FLEXPEN 100 UNIT/ML Pen Generic drug:  Insulin Detemir 30 Units every evening.   memantine 5 MG tablet Commonly known as:  NAMENDA Take 5 mg by mouth 2 (two) times daily.   mirtazapine 15 MG tablet Commonly known as:  REMERON Take 15 mg by mouth at bedtime.   sertraline 50 MG tablet Commonly known as:  ZOLOFT Take 50 mg by mouth at bedtime.   TRESIBA FLEXTOUCH 100 UNIT/ML Sopn FlexTouch Pen Generic drug:  insulin degludec Inject 50 Units into the skin at bedtime.   Vitamin D3 5000 units Tabs Take 5,000 Units by mouth once a week. mondays      No Known Allergies Follow-up Information    Mansouraty, Telford Nab., MD Follow up on 11/19/2017.   Specialties:  Gastroenterology, Internal Medicine Why:  at 4pm. Please arrive 10 minutes early Contact information: [place] [address] [phone]  The results of significant diagnostics from this hospitalization (including imaging, microbiology, ancillary and laboratory) are listed below for reference.    Significant Diagnostic Studies: Nm Hepatobiliary Liver Func  Result Date: 10/18/2017 CLINICAL DATA:  Right upper quadrant pain and vomiting for several days. EXAM: NUCLEAR MEDICINE HEPATOBILIARY IMAGING TECHNIQUE: Sequential images of the abdomen were obtained out  to 60 minutes following intravenous administration of radiopharmaceutical. Because no gallbladder activity was seen, 2.4 mg morphine was administered intravenously, and imaging was continued for another 30 minutes. RADIOPHARMACEUTICALS:  8.1 mCi Tc-1m  Choletec IV COMPARISON:  None. FINDINGS: Prompt uptake and biliary excretion of activity by the liver is seen. Gallbladder activity is visualized following morphine administration, consistent with patency of cystic duct. Biliary activity passes into small bowel, consistent with patent common bile duct. IMPRESSION: Patency of cystic duct and common bile duct are demonstrated. Electronically Signed   By: Earle Gell M.D.   On: 10/18/2017 19:16   US Renal  Result Date: 10/19/2017 CLINICAL DATA:  Acute kidney injury EXAM: RENAL / URINARY TRACT ULTRASOUND COMPLETE COMPARISON:  CT 10/17/2017 FINDINGS: Right Kidney: Length: 10.5 cm. Cortical thinning and increased echotexture. No hydronephrosis. 1.4 cm lower pole cyst. Left Kidney: Length: 10.2 cm. Cortical thinning and increased echotexture. No hydronephrosis. Bladder: Appears normal for degree of bladder distention. IMPRESSION: Cortical thinning and mildly increased echotexture bilaterally compatible with chronic medical renal disease. No acute findings. Electronically Signed   By: Rolm Baptise M.D.   On: 10/19/2017 10:02   Mr Abdomen Mrcp Wo Contrast  Result Date: 10/18/2017 CLINICAL DATA:  Abnormal liver function test. History of colon cancer status post right hemicolectomy. Gallbladder distension without gallstone. EXAM: MRI ABDOMEN WITHOUT CONTRAST  (INCLUDING MRCP) TECHNIQUE: Multiplanar multisequence MR imaging of the abdomen was performed. Heavily T2-weighted images of the biliary and pancreatic ducts were obtained, and three-dimensional MRCP images were rendered by post processing. COMPARISON:  CT 10/17/2017 and ultrasound 10/17/2017 FINDINGS: Lower chest: No acute findings. Hepatobiliary: There is no  focal liver abnormality identified. The gallbladder appears markedly distended measuring approximately 7.3 by 6.2 by 7.2 cm (volume = 170 cm^3). Mild diffuse gallbladder wall thickening is identified with mild pericholecystic fluid. Increase caliber of the common bile duct measures up to 1.3 cm. Sludge within the common bile duct is identified but no choledocholithiasis. Pancreas: There is diffuse main duct dilatation with a large calcification identified within the main duct within the mid tail measuring 7 mm. Multiple calcifications are identified throughout the main duct. The largest is in the tail of pancreas measuring up to 9 mm, image 14/12. No peripancreatic fat stranding or free fluid identified. Spleen:  Within normal limits in size and appearance. Adrenals/Urinary Tract: Normal adrenal glands. Bilateral kidney cysts are identified. The largest is in the inferior pole of right kidney measuring 1.4 cm. Stomach/Bowel: Moderate distension of the stomach. The visualized proximal and mid small bowel loops are unremarkable. The patient is status post right hemicolectomy. Enterocolonic anastomosis within the right upper quadrant of the abdomen is identified. There is soft tissue stranding and free fluid surrounding the entero colonic anastomosis. Vascular/Lymphatic: Normal appearance of the abdominal aorta. No enlarged retroperitoneal lymph nodes. Other: There is a small volume of ascites extending over the right lobe of liver. There is a tiny peritoneal nodule overlying the lateral segment of left lobe of liver, image 15/5. Small soft tissue nodule adjacent to scratch set small peritoneal nodule adjacent to the gastric antrum is noted, image 31/5. Musculoskeletal: No suspicious bone lesions  identified. IMPRESSION: 1. Again seen is diffuse gallbladder distension, gallbladder wall edema and mild pericholecystic fluid. No gallstones identified. Cannot exclude acute acalculous cholecystitis. 2. Increase caliber of  the common bile duct without choledocholithiasis or obstructing mass. 3. There is abnormal free fluid and soft tissue stranding in the right upper quadrant of the abdomen. In the setting of acute cholecystitis this likely reflects inflammatory changes. 4. Two tiny peritoneal nodules are identified, nonspecific. In a patient who has a history of colon cancer clinical correlation is advised within additional follow-up imaging in 3-6 months to ensure stability of these lesions to exclude recurrent tumor/peritoneal disease. Electronically Signed   By: Kerby Moors M.D.   On: 10/18/2017 09:39   Mr 3d Recon At Scanner  Result Date: 10/18/2017 CLINICAL DATA:  Abnormal liver function test. History of colon cancer status post right hemicolectomy. Gallbladder distension without gallstone. EXAM: MRI ABDOMEN WITHOUT CONTRAST  (INCLUDING MRCP) TECHNIQUE: Multiplanar multisequence MR imaging of the abdomen was performed. Heavily T2-weighted images of the biliary and pancreatic ducts were obtained, and three-dimensional MRCP images were rendered by post processing. COMPARISON:  CT 10/17/2017 and ultrasound 10/17/2017 FINDINGS: Lower chest: No acute findings. Hepatobiliary: There is no focal liver abnormality identified. The gallbladder appears markedly distended measuring approximately 7.3 by 6.2 by 7.2 cm (volume = 170 cm^3). Mild diffuse gallbladder wall thickening is identified with mild pericholecystic fluid. Increase caliber of the common bile duct measures up to 1.3 cm. Sludge within the common bile duct is identified but no choledocholithiasis. Pancreas: There is diffuse main duct dilatation with a large calcification identified within the main duct within the mid tail measuring 7 mm. Multiple calcifications are identified throughout the main duct. The largest is in the tail of pancreas measuring up to 9 mm, image 14/12. No peripancreatic fat stranding or free fluid identified. Spleen:  Within normal limits in size  and appearance. Adrenals/Urinary Tract: Normal adrenal glands. Bilateral kidney cysts are identified. The largest is in the inferior pole of right kidney measuring 1.4 cm. Stomach/Bowel: Moderate distension of the stomach. The visualized proximal and mid small bowel loops are unremarkable. The patient is status post right hemicolectomy. Enterocolonic anastomosis within the right upper quadrant of the abdomen is identified. There is soft tissue stranding and free fluid surrounding the entero colonic anastomosis. Vascular/Lymphatic: Normal appearance of the abdominal aorta. No enlarged retroperitoneal lymph nodes. Other: There is a small volume of ascites extending over the right lobe of liver. There is a tiny peritoneal nodule overlying the lateral segment of left lobe of liver, image 15/5. Small soft tissue nodule adjacent to scratch set small peritoneal nodule adjacent to the gastric antrum is noted, image 31/5. Musculoskeletal: No suspicious bone lesions identified. IMPRESSION: 1. Again seen is diffuse gallbladder distension, gallbladder wall edema and mild pericholecystic fluid. No gallstones identified. Cannot exclude acute acalculous cholecystitis. 2. Increase caliber of the common bile duct without choledocholithiasis or obstructing mass. 3. There is abnormal free fluid and soft tissue stranding in the right upper quadrant of the abdomen. In the setting of acute cholecystitis this likely reflects inflammatory changes. 4. Two tiny peritoneal nodules are identified, nonspecific. In a patient who has a history of colon cancer clinical correlation is advised within additional follow-up imaging in 3-6 months to ensure stability of these lesions to exclude recurrent tumor/peritoneal disease. Electronically Signed   By: Kerby Moors M.D.   On: 10/18/2017 09:39    Microbiology: No results found for this or any previous visit (from the  past 240 hour(s)).   Labs: Basic Metabolic Panel: Recent Labs  Lab  10/18/17 0217 10/19/17 0248 10/20/17 0303 10/21/17 0745 10/22/17 0400  NA 139 135 136 138 136  K 3.4* 4.0 4.0 3.6 3.5  CL 108 110 112* 112* 112*  CO2 18* 16* 15* 17* 17*  GLUCOSE 108* 183* 91 100* 93  BUN 44* 44* 34* 28* 28*  CREATININE 3.50* 3.52* 2.90* 2.77* 2.69*  CALCIUM 8.4* 8.2* 8.0* 8.1* 7.9*  PHOS  --   --   --  2.6  --    Liver Function Tests: Recent Labs  Lab 10/17/17 1827 10/18/17 0217 10/19/17 0248 10/20/17 0303 10/21/17 0745  AST 114* 102* 56* 28  --   ALT 155* 140* 99* 68*  --   ALKPHOS 156* 150* 123 107  --   BILITOT 5.5* 5.3* 2.2* 1.3*  --   PROT 7.1 6.8 6.0* 5.9*  --   ALBUMIN 3.4* 3.2* 2.7* 2.5* 2.6*   Recent Labs  Lab 10/17/17 1827  LIPASE 109*   No results for input(s): AMMONIA in the last 168 hours. CBC: Recent Labs  Lab 10/17/17 1827 10/18/17 0217 10/19/17 0248 10/19/17 0813 10/20/17 0303 10/21/17 0745 10/21/17 1220 10/22/17 0917  WBC 15.3* 14.2* 10.6*  --  8.1  --   --   --   NEUTROABS 12.9* 12.2*  --   --   --   --   --   --   HGB 9.4* 9.0* 7.7* 7.6* 7.5* 8.2* 8.0* 8.5*  HCT 26.6* 26.2* 22.1* 22.2* 22.1* 24.0* 23.4* 25.7*  MCV 73.3* 73.6* 74.2*  --  74.7*  --   --   --   PLT 169 168 147*  --  167  --   --   --    Cardiac Enzymes: No results for input(s): CKTOTAL, CKMB, CKMBINDEX, TROPONINI in the last 168 hours. BNP: BNP (last 3 results) No results for input(s): BNP in the last 8760 hours.  ProBNP (last 3 results) No results for input(s): PROBNP in the last 8760 hours.  CBG: Recent Labs  Lab 10/22/17 0750 10/22/17 1226 10/22/17 2216 10/23/17 0655 10/23/17 0831  GLUCAP 86 138* 139* 96 90       Signed:  Tayvia Faughnan  [facility] 10/23/2017, 8:49 AM

## 2017-10-23 NOTE — Progress Notes (Signed)
Pt discharged to facility. Pt taken to facility by daughter. Pt taken to front of building via w/c by volunteers

## 2017-10-23 NOTE — Clinical Social Work Placement (Signed)
   CLINICAL SOCIAL WORK PLACEMENT  NOTE  Date:  10/23/2017  Patient Details  Name: Danielle Bright MRN: [id number] Date of Birth: [birth year]  Clinical Social Work is seeking post-discharge placement for this patient at the [place] level of care (*CSW will initial, date and re-position this form in  chart as items are completed):  Yes   Patient/family provided with [facility] Work Department's list of facilities offering this level of care within the geographic area requested by the patient (or if unable, by the patient's family).  Yes   Patient/family informed of their freedom to choose among providers that offer the needed level of care, that participate in Medicare, Medicaid or managed care program needed by the patient, have an available bed and are willing to accept the patient.  Yes   Patient/family informed of [facility]'s ownership interest in [facility] and [facility], as well as of the fact that they are under no obligation to receive care at these facilities.  PASRR submitted to EDS on       PASRR number received on 10/22/17     Existing PASRR number confirmed on       FL2 transmitted to all facilities in geographic area requested by pt/family on 10/22/17     FL2 transmitted to all facilities within larger geographic area on       Patient informed that his/her managed care company has contracts with or will negotiate with certain facilities, including the following:        Yes   Patient/family informed of bed offers received.  Patient [facility] bed at [facility]     Physician recommends and patient chooses bed at      Patient to be transferred to [facility] on 10/23/17.  Patient to be transferred to facility by personal car     Patient family notified on 10/23/17 of transfer.  Name of family member notified:  [name]     PHYSICIAN       Additional Comment:     _______________________________________________ Alexander Mt, [place] 10/23/2017, 11:56 AM

## 2017-11-19 ENCOUNTER — Ambulatory Visit: Payer: Commercial Indemnity | Admitting: Gastroenterology

## 2019-03-03 DEATH — deceased

## 2019-12-14 IMAGING — MR MR 3D RECON AT [facility]
9 of 13 series · 39 of 48 positions shown · non-contrast
Comparison: CT 10/17/2017 and ultrasound 10/17/2017

CLINICAL DATA: Abnormal liver function test. History of colon
cancer status post right hemicolectomy. Gallbladder distension
without gallstone.

EXAM:
MRI ABDOMEN WITHOUT CONTRAST  (INCLUDING MRCP)
TECHNIQUE: Multiplanar multisequence MR imaging of the abdomen was performed.
Heavily T2-weighted images of the biliary and pancreatic ducts were
obtained, and three-dimensional MRCP images were rendered by post
processing.

[Series 4: bSSFP · coronal · 6.0mm · 0.74mm/px · 2 of 34 slices shown]
[im 1/34]
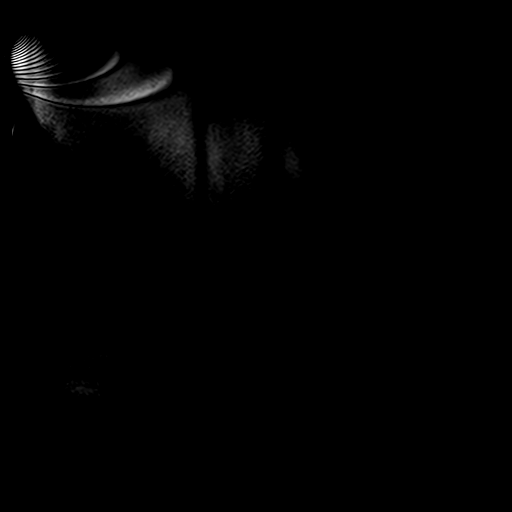
[im 34/34]
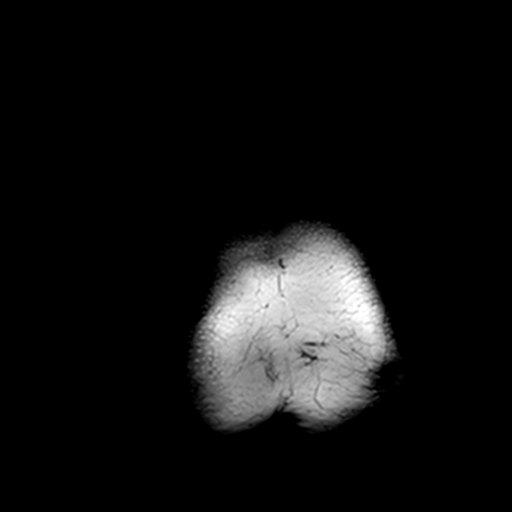

[Series 5: ax haste · axial · 6.0mm · 1.19mm/px · z∈[-163,+75]mm · 2 of 34 slices shown]
[im 1/34]
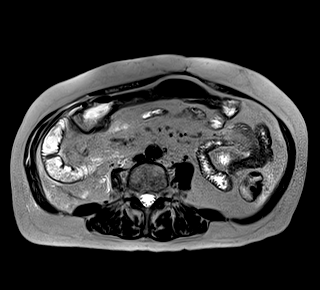
[im 34/34]
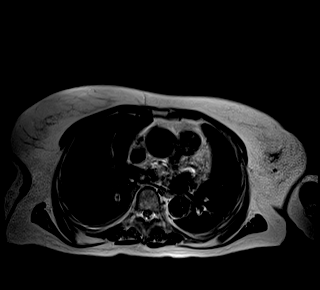

[Series 6: T2 fat-sat · axial · 6.0mm · 1.19mm/px · z∈[-180,+58]mm · 3 of 34 slices shown]
[im 1/34]
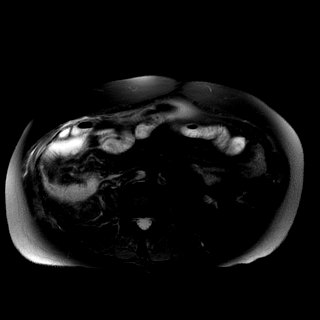
[im 17/34]
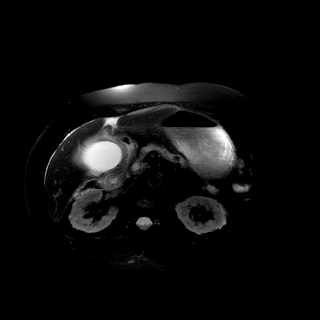
[im 34/34]
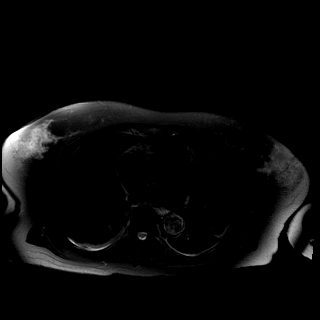

[Series 9: DWI · axial · 6.0mm · 1.42mm/px · z∈[-180,+58]mm · 9 of 102 slices shown (1 of 2)]
[im 1/102]
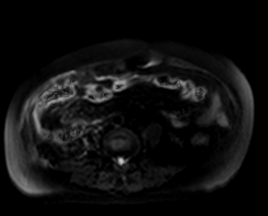
[im 13/102]
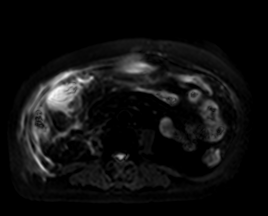
[im 26/102]
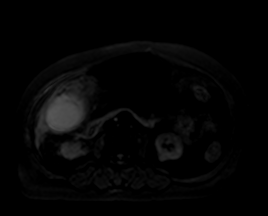
[im 38/102]
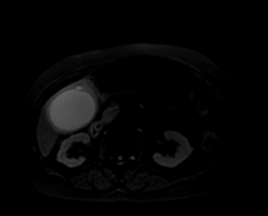
[im 51/102]
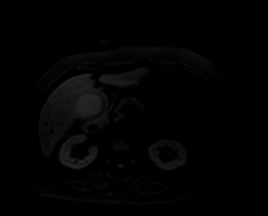
[im 64/102]
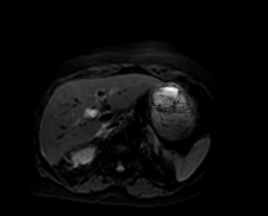
[im 76/102]
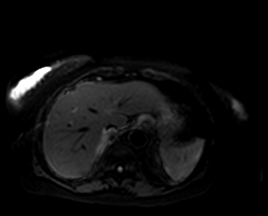
[im 89/102]
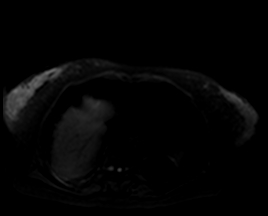
[im 102/102]
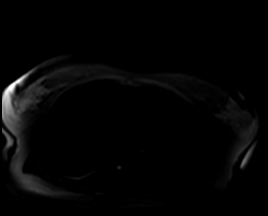

[Series 10: DWI · axial · 6.0mm · 1.42mm/px · z∈[-180,+58]mm · 3 of 34 slices shown (2 of 2)]
[im 1/34]
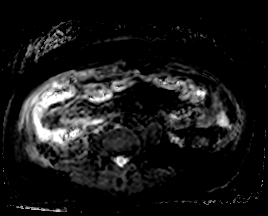
[im 17/34]
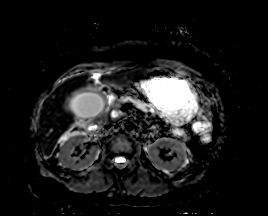
[im 34/34]
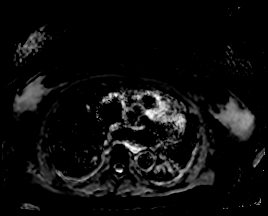

[Series 11: ax in and · axial · 3.0mm · 1.19mm/px · z∈[-155,+58]mm · 12 of 144 slices shown]
[im 1/144]
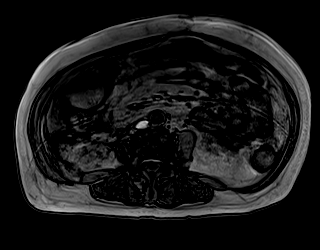
[im 14/144]
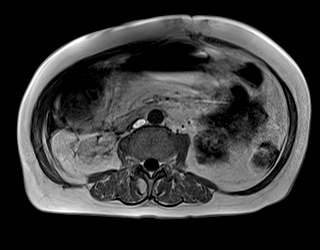
[im 27/144]
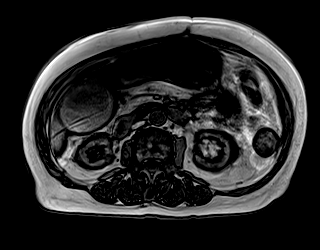
[im 40/144]
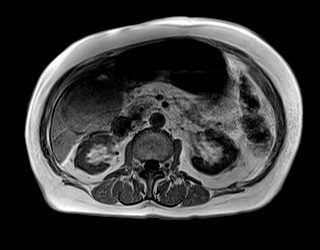
[im 53/144]
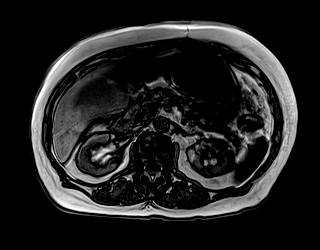
[im 66/144]
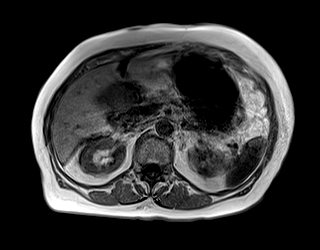
[im 79/144]
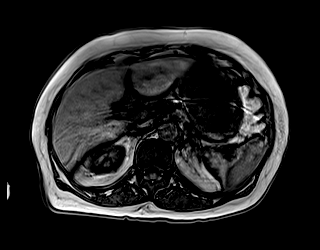
[im 92/144]
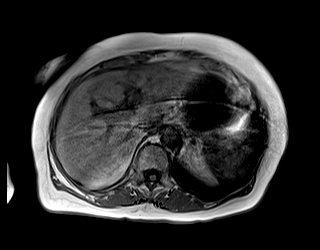
[im 105/144]
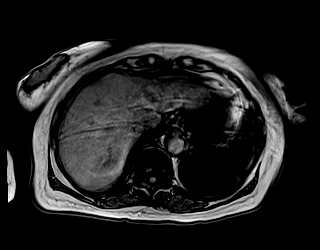
[im 118/144]
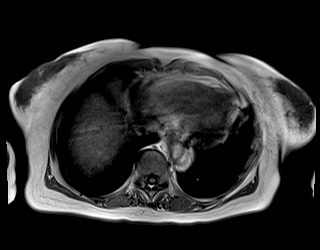
[im 131/144]
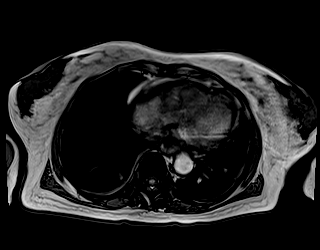
[im 144/144]
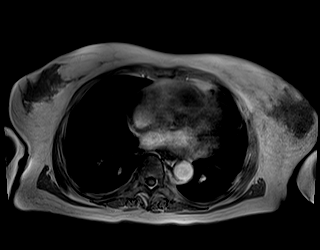

[Series 12: MRCP · coronal · 4.0mm · 1.12mm/px · 1 of 15 slices shown]
[im 1/15]
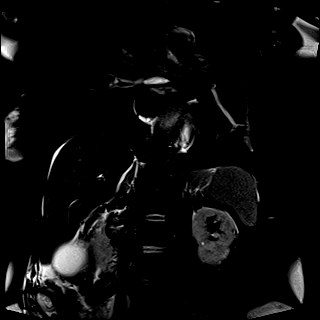

[Series 13: radials · coronal · 50.0mm · 0.78mm/px · 1 of 5 slices shown]
[im 1/5]
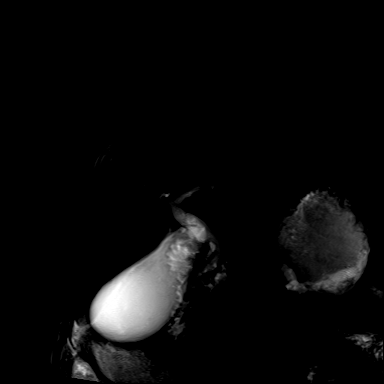

[Series 14: T1 dynamic · axial · non-contrast · 3.0mm · 1.19mm/px · z∈[-155,+58]mm · 6 of 72 slices shown]
[im 1/72]
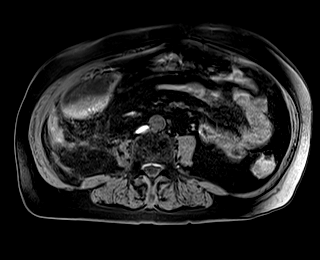
[im 15/72]
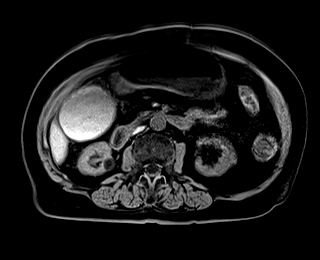
[im 29/72]
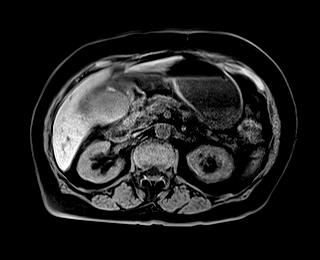
[im 43/72]
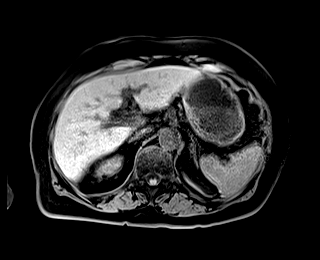
[im 57/72]
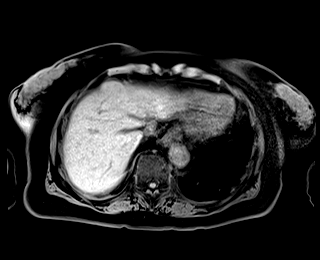
[im 72/72]
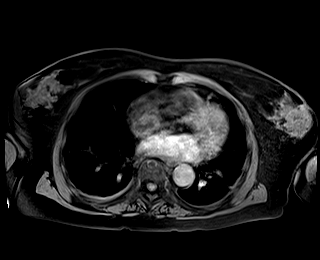

[39 of 48 positions shown; findings below may reference images not displayed]

FINDINGS: Lower chest: No acute findings.

Hepatobiliary: There is no focal liver abnormality identified. The
gallbladder appears markedly distended measuring approximately
by 6.2 by 7.2 cm (volume = 170 cm^3). Mild diffuse gallbladder
wall thickening is identified with mild pericholecystic fluid.
Increase caliber of the common bile duct measures up to 1.3 cm.
Sludge within the common bile duct is identified but no
choledocholithiasis.

Pancreas: There is diffuse main duct dilatation with a large
calcification identified within the main duct within the mid tail
measuring 7 mm. Multiple calcifications are identified throughout
the main duct. The largest is in the tail of pancreas measuring up
to 9 mm, image [DATE]. No peripancreatic fat stranding or free fluid
identified.

Spleen:  Within normal limits in size and appearance.

Adrenals/Urinary Tract: Normal adrenal glands. Bilateral kidney
cysts are identified. The largest is in the inferior pole of right
kidney measuring 1.4 cm.

Stomach/Bowel: Moderate distension of the stomach. The visualized
proximal and mid small bowel loops are unremarkable. The patient is
status post right hemicolectomy. Enterocolonic anastomosis within
the right upper quadrant of the abdomen is identified. There is soft
tissue stranding and free fluid surrounding the entero colonic
anastomosis.

Vascular/Lymphatic: Normal appearance of the abdominal aorta. No
enlarged retroperitoneal lymph nodes.

Other: There is a small volume of ascites extending over the right
lobe of liver. There is a tiny peritoneal nodule overlying the
lateral segment of left lobe of liver, image [DATE]. Small soft tissue
nodule adjacent to scratch set small peritoneal nodule adjacent to
the gastric antrum is noted, image [DATE].

Musculoskeletal: No suspicious bone lesions identified.
IMPRESSION: 1. Again seen is diffuse gallbladder distension, gallbladder wall
edema and mild pericholecystic fluid. No gallstones identified.
Cannot exclude acute acalculous cholecystitis.
2. Increase caliber of the common bile duct without
choledocholithiasis or obstructing mass.
3. There is abnormal free fluid and soft tissue stranding in the
right upper quadrant of the abdomen. In the setting of acute
cholecystitis this likely reflects inflammatory changes.
4. Two tiny peritoneal nodules are identified, nonspecific. In a
patient who has a history of colon cancer clinical correlation is
advised within additional follow-up imaging in 3-6 months to ensure
stability of these lesions to exclude recurrent tumor/peritoneal
disease.
